# Patient Record
Sex: Male | Born: 1965 | Race: White | Hispanic: No | Marital: Married | Smoking: Light tobacco smoker
Health system: Southern US, Community
[De-identification: ages and names within clinical notes are randomized; demographics above are authoritative.]

## PROBLEM LIST (undated history)

## (undated) DIAGNOSIS — K900 Celiac disease: Secondary | ICD-10-CM

## (undated) DIAGNOSIS — I2510 Atherosclerotic heart disease of native coronary artery without angina pectoris: Secondary | ICD-10-CM

## (undated) DIAGNOSIS — I2111 ST elevation (STEMI) myocardial infarction involving right coronary artery: Secondary | ICD-10-CM

## (undated) DIAGNOSIS — Z951 Presence of aortocoronary bypass graft: Secondary | ICD-10-CM

## (undated) DIAGNOSIS — F419 Anxiety disorder, unspecified: Secondary | ICD-10-CM

## (undated) DIAGNOSIS — I742 Embolism and thrombosis of arteries of the upper extremities: Secondary | ICD-10-CM

## (undated) HISTORY — PX: OTHER SURGICAL HISTORY: SHX169

---

## 1998-04-26 ENCOUNTER — Emergency Department (HOSPITAL_COMMUNITY): Admission: EM | Admit: 1998-04-26 | Discharge: 1998-04-26 | Payer: Self-pay | Admitting: Emergency Medicine

## 2003-04-10 ENCOUNTER — Inpatient Hospital Stay (HOSPITAL_COMMUNITY): Admission: EM | Admit: 2003-04-10 | Discharge: 2003-04-12 | Payer: Self-pay | Admitting: *Deleted

## 2003-04-22 ENCOUNTER — Encounter: Admission: RE | Admit: 2003-04-22 | Discharge: 2003-04-22 | Payer: Self-pay | Admitting: Cardiology

## 2003-05-13 ENCOUNTER — Ambulatory Visit (HOSPITAL_COMMUNITY): Admission: RE | Admit: 2003-05-13 | Discharge: 2003-05-13 | Payer: Self-pay | Admitting: *Deleted

## 2003-08-13 ENCOUNTER — Encounter: Admission: RE | Admit: 2003-08-13 | Discharge: 2003-08-13 | Payer: Self-pay | Admitting: General Surgery

## 2004-12-31 ENCOUNTER — Emergency Department (HOSPITAL_COMMUNITY): Admission: EM | Admit: 2004-12-31 | Discharge: 2004-12-31 | Payer: Self-pay | Admitting: Emergency Medicine

## 2005-02-03 IMAGING — CT CT ABDOMEN W/ CM
1 series · 16 of 32 positions shown, 20 images · IV contrast (omnipaque)
Comparison: none

CLINICAL DATA: Mid abdominal pain. Nausea.  Increased white blood cell count and lipase.  
 CT ABDOMEN AND PELVIS WITH AND WITHOUT CONTRAST
TECHNIQUE: Multislice axial images were obtained through the abdomen and pelvis after administration of oral contrast and 150 cc of Omnipaque 300 IV contrast.  
 ABDOMEN
 The lung bases are clear.  The liver, spleen, pancreas, adrenal glands, and both kidneys have a normal appearance.  There is no abdominal adenopathy or free fluid.  No gross inflammatory changes are seen.  The appendix is well seen and has a normal appearance.  
 IMPRESSION
 Normal CT scan of the abdomen.  
 PELVIS
 The urinary bladder has an unremarkable appearance.  No free pelvic fluid or adenopathy are seen.  The osseous structures are unremarkable.  The bowel has a normal appearance with no evidence of gross inflammation or obstruction.  The osseous structures are normal.  
 Normal CT scan of the pelvis.

[Series 2: abd pelvis · axial · 0.86mm/px · z∈[-515,-105]mm · 16 of 116 slices shown, 20 images]
[im 8/116  soft-tissue]
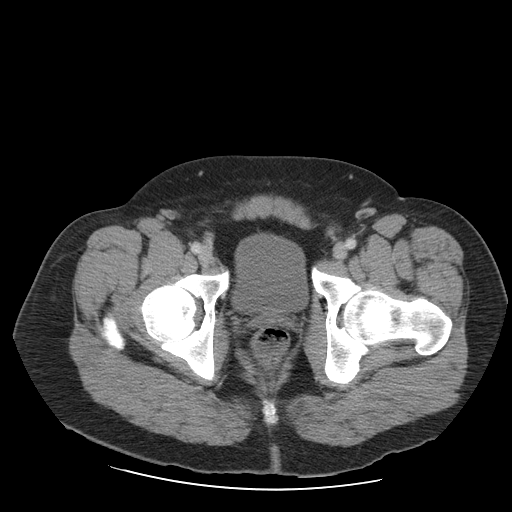
[im 8/116  bone]
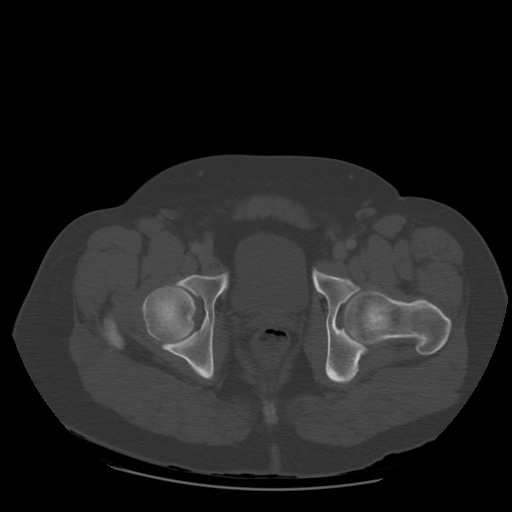
[im 15/116  soft-tissue]
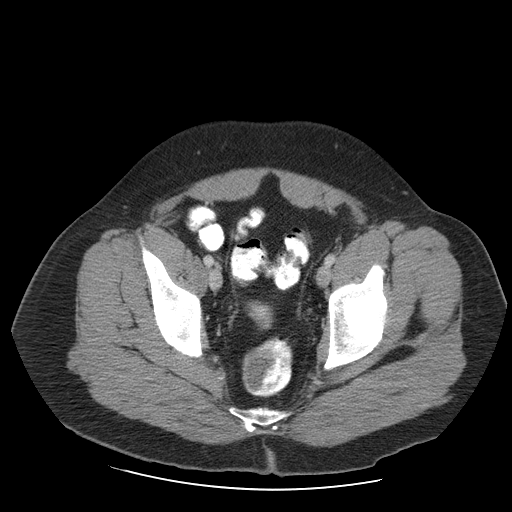
[im 23/116  soft-tissue]
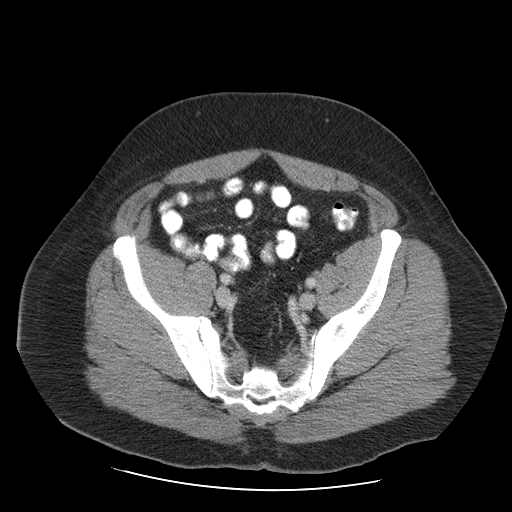
[im 30/116  soft-tissue]
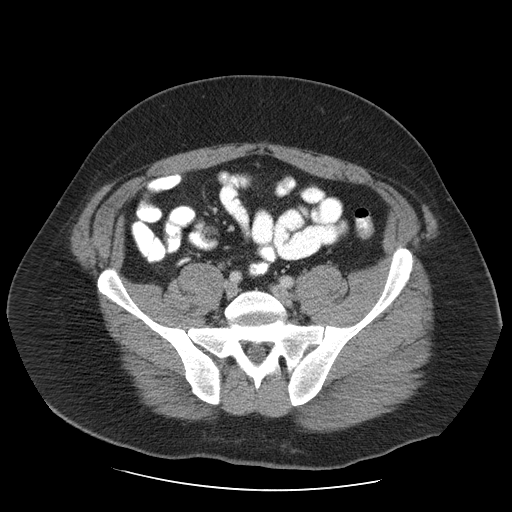
[im 38/116  soft-tissue]
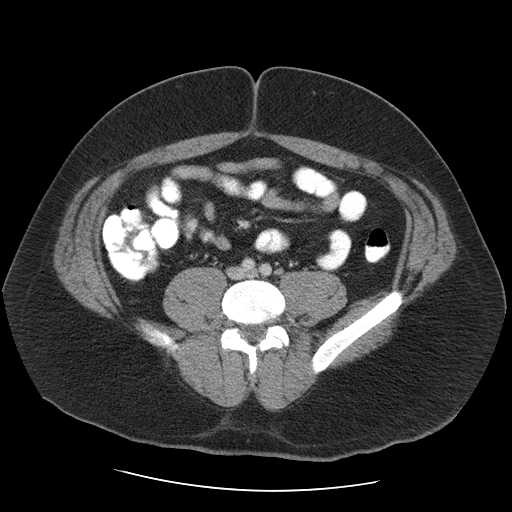
[im 45/116  soft-tissue]
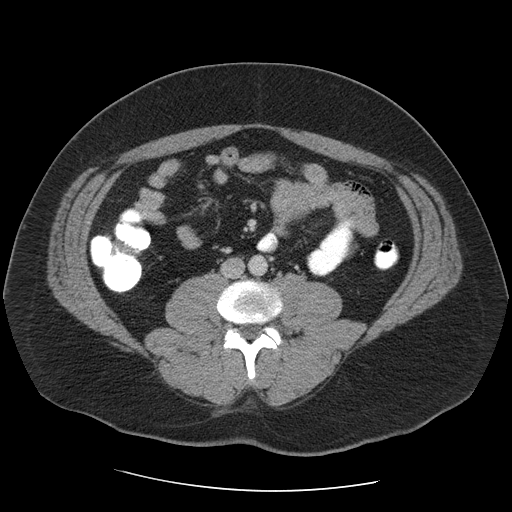
[im 52/116  soft-tissue]
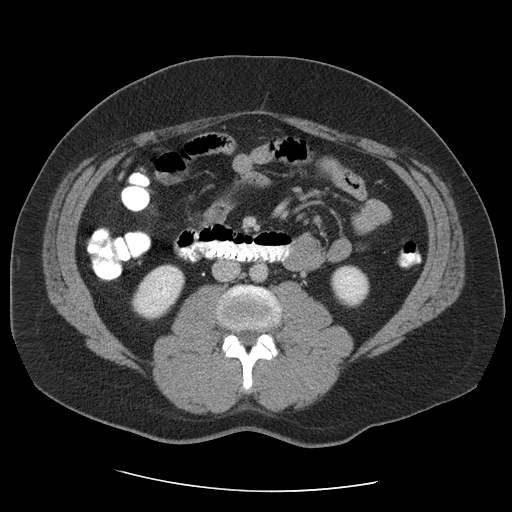
[im 64/116  soft-tissue]
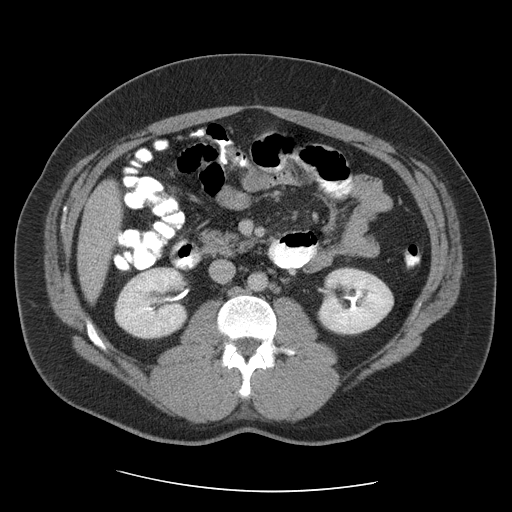
[im 71/116  soft-tissue]
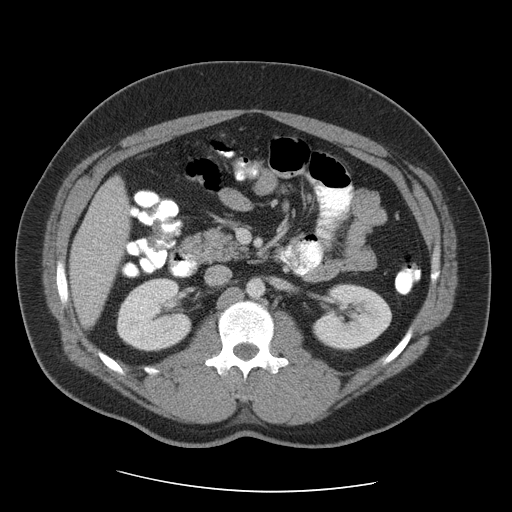
[im 71/116  bone]
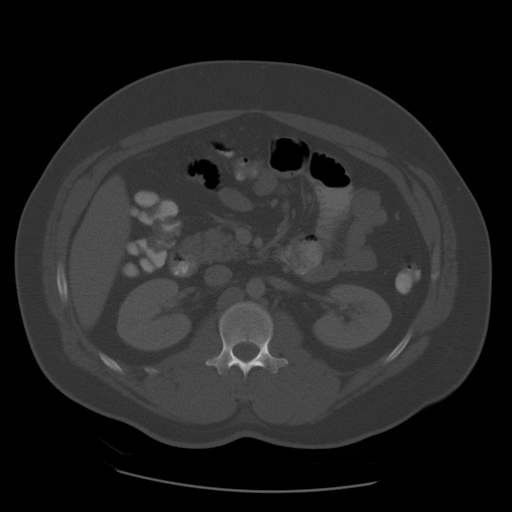
[im 78/116  soft-tissue]
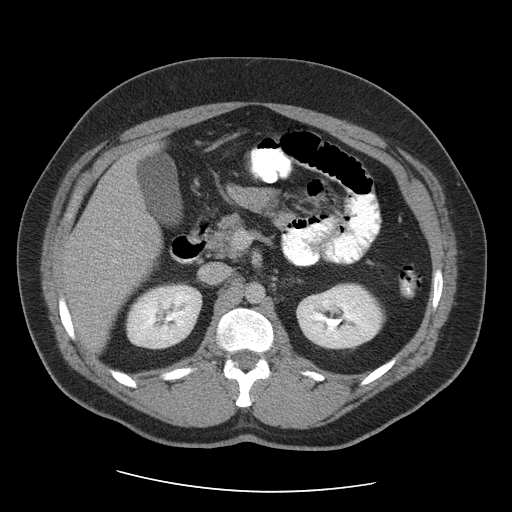
[im 86/116  soft-tissue]
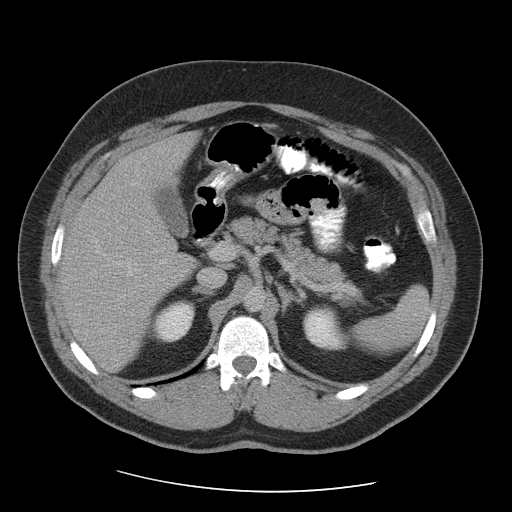
[im 93/116  soft-tissue]
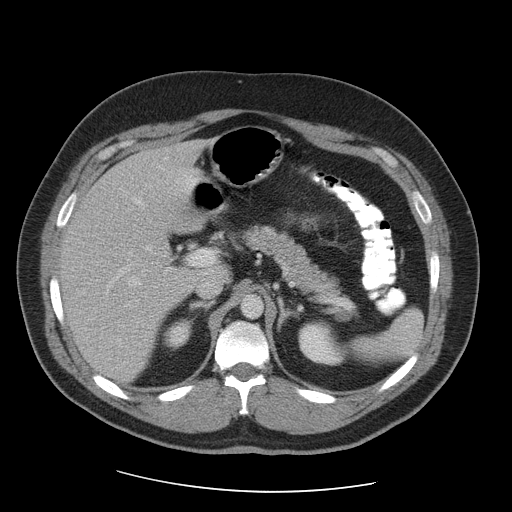
[im 101/116  soft-tissue]
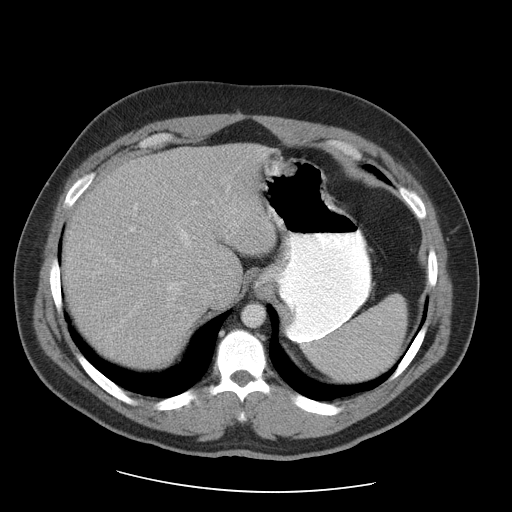
[im 101/116  lung]
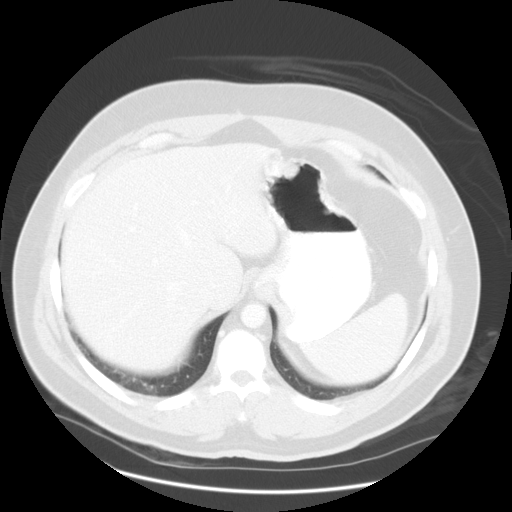
[im 104/116  lung]
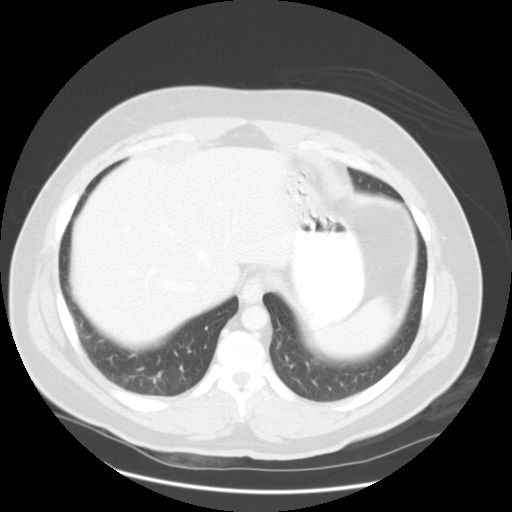
[im 108/116  soft-tissue]
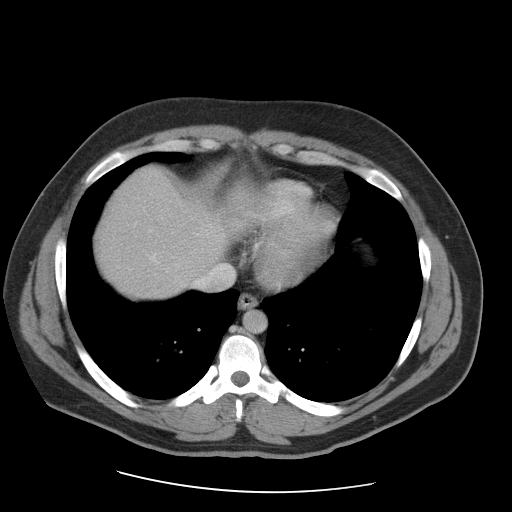
[im 108/116  lung]
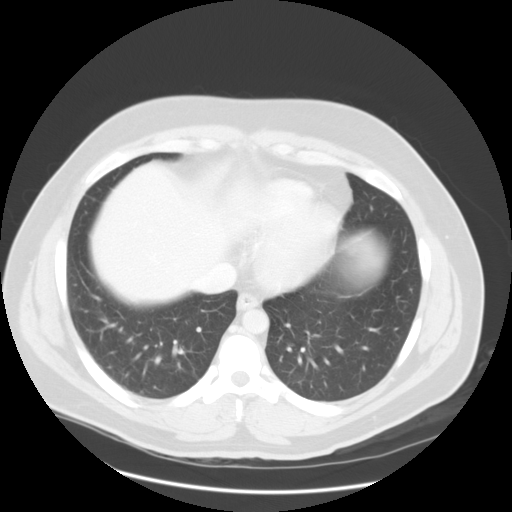
[im 112/116  lung]
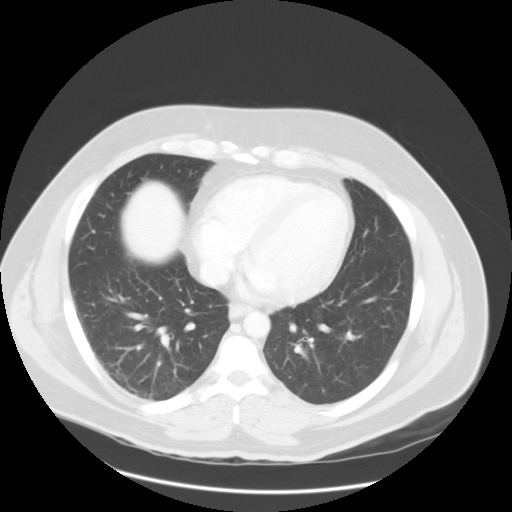

[16 of 32 positions shown; findings below may reference images not displayed]

## 2005-02-04 IMAGING — US US ABDOMEN COMPLETE
1 series · 14 of 25 positions shown · non-contrast
Comparison: none

CLINICAL DATA: Abdominal pain.  Pancreatitis.  
 ABDOMINAL  ULTRASOUND 04/11/03 AT 8788 HOURS

[Series 1: us abd · 0.33mm/px · 14 of 65 slices shown]
[im 1/65]
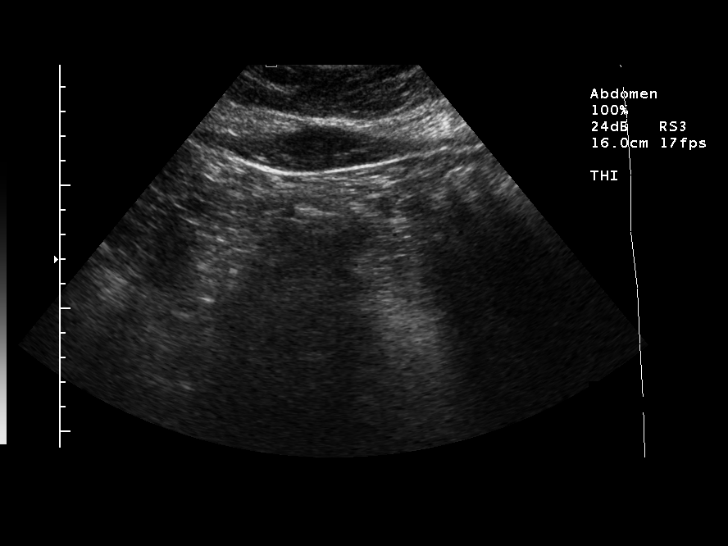
[im 6/65]
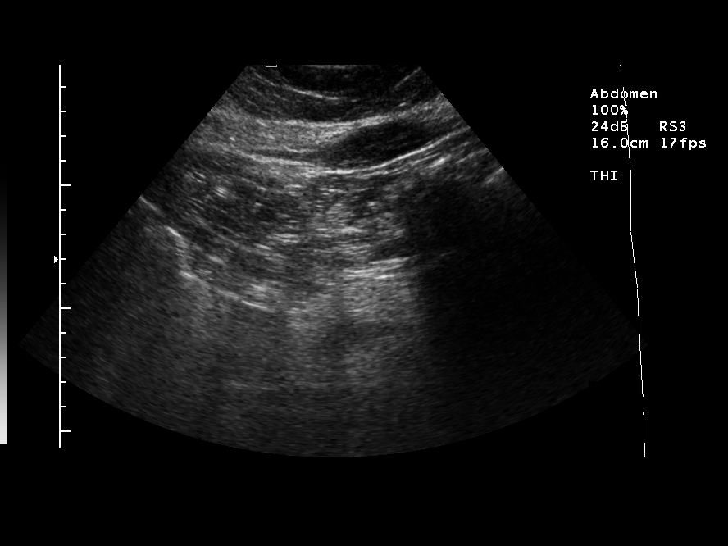
[im 11/65]
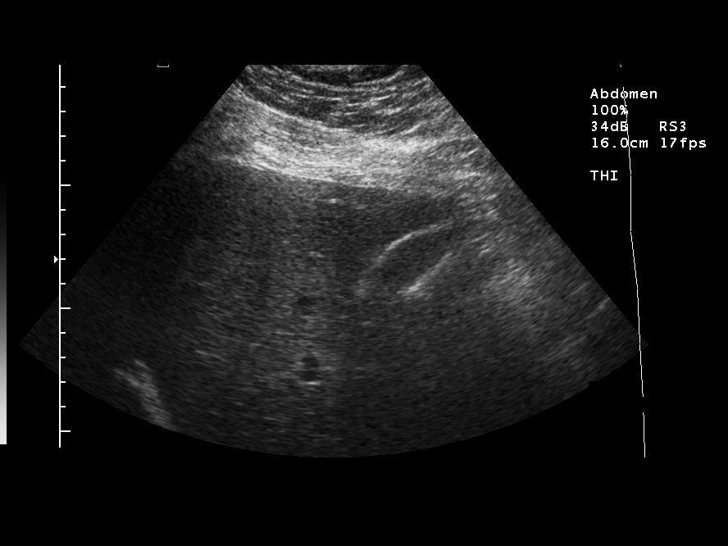
[im 17/65]
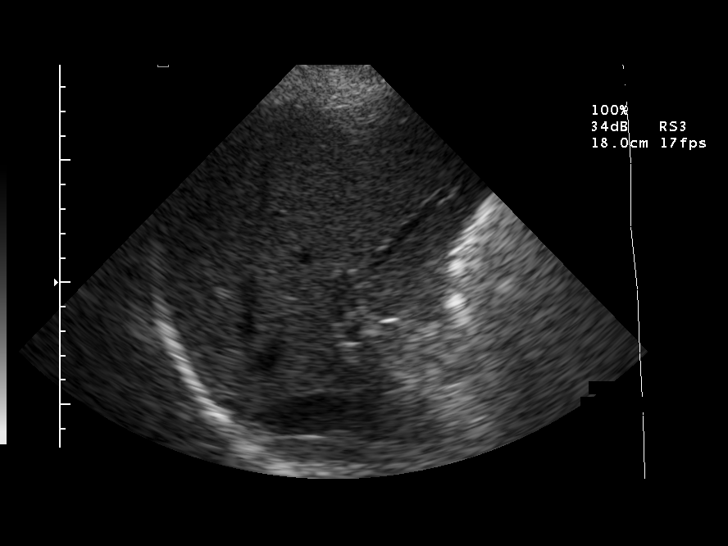
[im 22/65]
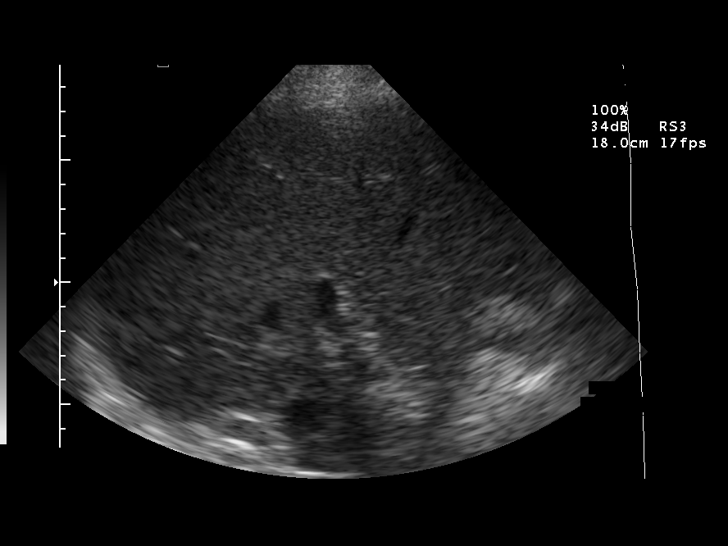
[im 25/65]
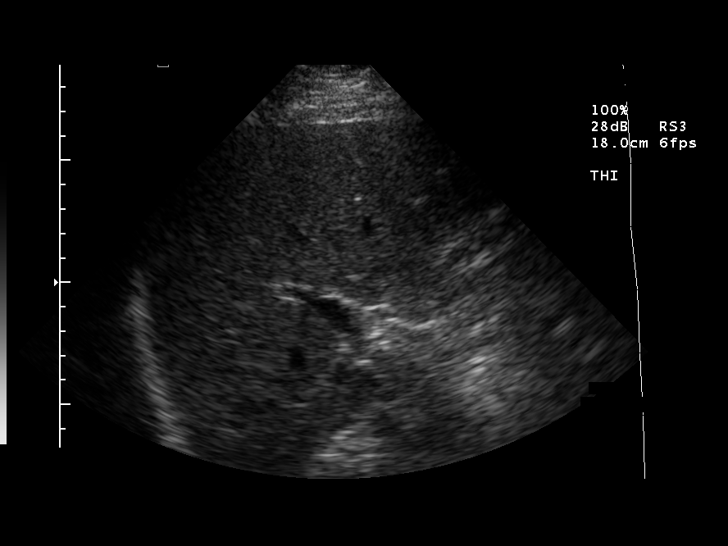
[im 30/65]
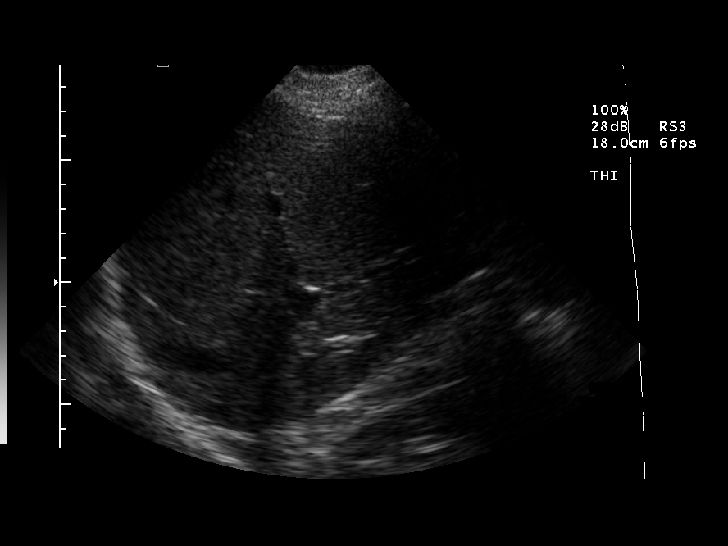
[im 35/65]
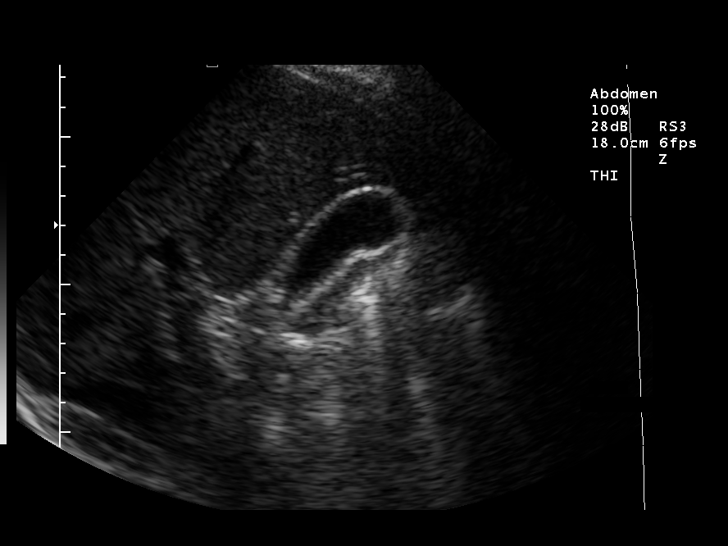
[im 41/65]
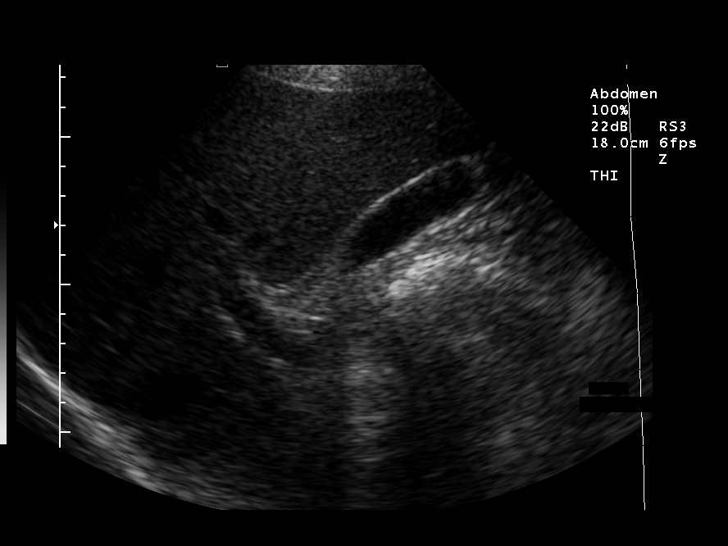
[im 43/65]
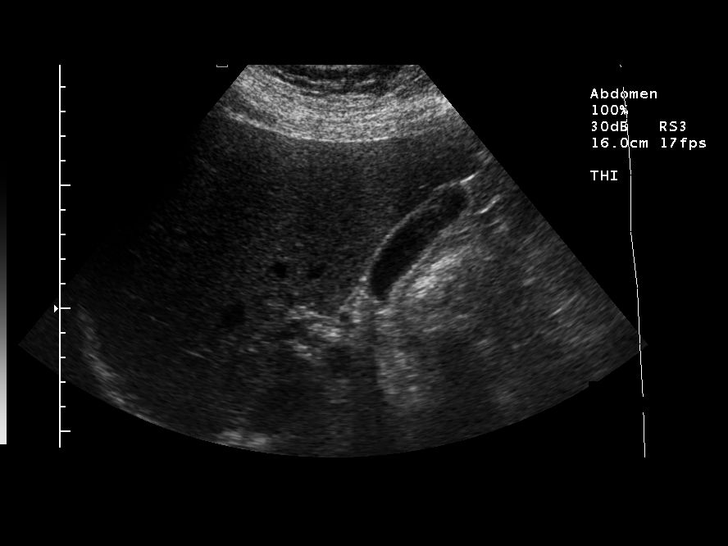
[im 49/65]
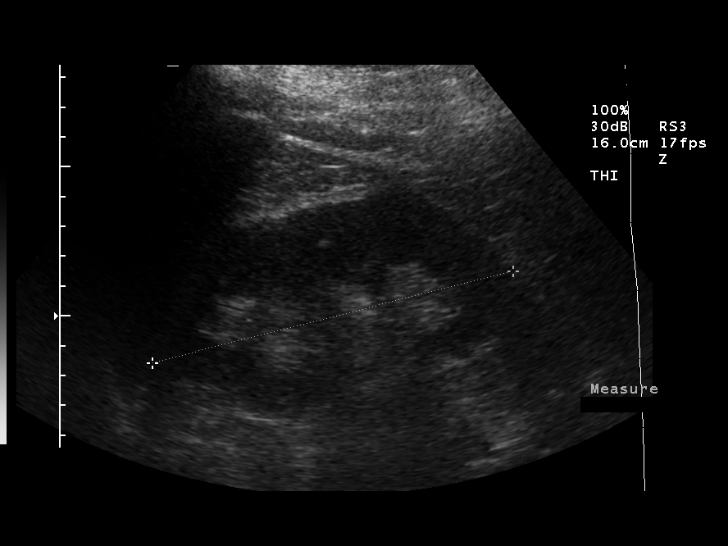
[im 54/65]
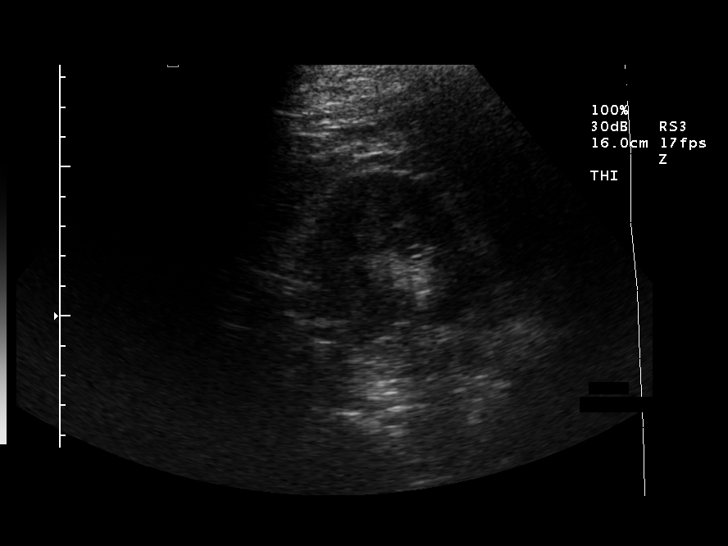
[im 59/65]
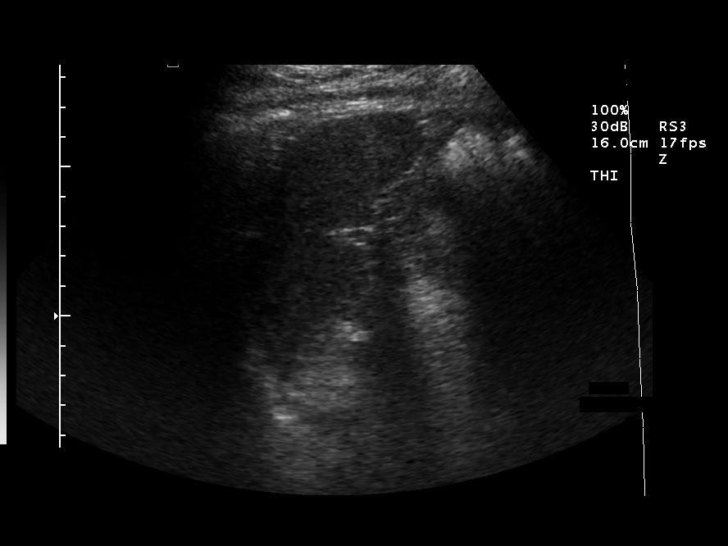
[im 65/65]
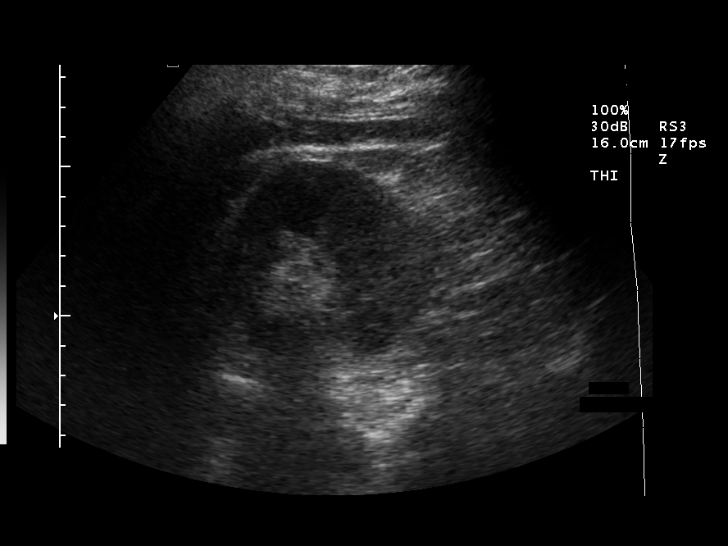

[14 of 25 positions shown; findings below may reference images not displayed]

FINDINGS: The pancreas was obscured by overlying bowel gas and could not be evaluated.  The aorta was also obscured by overlying bowel gas.  
 The liver is within normal limits without focal mass effect. 
 Within the gallbladder, there is no pericholecystic fluid or gallstones.  The wall measures 3.5 mm and is borderline thickened.  
 The spleen is within normal limits.  The right and left kidneys are 12.4 and 11.6 cm in length and are without hydronephrosis or mass effect.  
 IMPRESSION
 Limited study in that the pancreas and aorta were obscured. 
 Borderline gallbladder wall thickening without gallstones.  This is nonspecific and can be seen with inflammation of adjacent organs such as pancreatitis.

## 2006-06-13 ENCOUNTER — Ambulatory Visit: Payer: Self-pay | Admitting: Family Medicine

## 2006-06-13 LAB — CONVERTED CEMR LAB
ALT: 27 units/L (ref 0–40)
AST: 29 units/L (ref 0–37)
Albumin: 4.2 g/dL (ref 3.5–5.2)
Alkaline Phosphatase: 97 units/L (ref 39–117)
Amylase: 152 units/L — ABNORMAL HIGH (ref 27–131)
BUN: 14 mg/dL (ref 6–23)
Basophils Absolute: 0 10*3/uL (ref 0.0–0.1)
Basophils Relative: 0.2 % (ref 0.0–1.0)
Bilirubin, Direct: 0.1 mg/dL (ref 0.0–0.3)
CO2: 30 meq/L (ref 19–32)
Calcium: 9.9 mg/dL (ref 8.4–10.5)
Chloride: 106 meq/L (ref 96–112)
Creatinine, Ser: 1.1 mg/dL (ref 0.4–1.5)
Eosinophils Absolute: 0.8 10*3/uL — ABNORMAL HIGH (ref 0.0–0.6)
Eosinophils Relative: 5.2 % — ABNORMAL HIGH (ref 0.0–5.0)
GFR calc Af Amer: 94 mL/min
GFR calc non Af Amer: 78 mL/min
Glucose, Bld: 115 mg/dL — ABNORMAL HIGH (ref 70–99)
HCT: 49.8 % (ref 39.0–52.0)
Hemoglobin: 17.2 g/dL — ABNORMAL HIGH (ref 13.0–17.0)
Lipase: 89 units/L — ABNORMAL HIGH (ref 11.0–59.0)
Lymphocytes Relative: 16.9 % (ref 12.0–46.0)
MCHC: 34.6 g/dL (ref 30.0–36.0)
MCV: 87.6 fL (ref 78.0–100.0)
Monocytes Absolute: 1.5 10*3/uL — ABNORMAL HIGH (ref 0.2–0.7)
Monocytes Relative: 9.7 % (ref 3.0–11.0)
Neutro Abs: 10.5 10*3/uL — ABNORMAL HIGH (ref 1.4–7.7)
Neutrophils Relative %: 68 % (ref 43.0–77.0)
Platelets: 360 10*3/uL (ref 150–400)
Potassium: 5.3 meq/L — ABNORMAL HIGH (ref 3.5–5.1)
RBC: 5.69 M/uL (ref 4.22–5.81)
RDW: 12.5 % (ref 11.5–14.6)
Sodium: 141 meq/L (ref 135–145)
Total Bilirubin: 1 mg/dL (ref 0.3–1.2)
Total Protein: 7.4 g/dL (ref 6.0–8.3)
WBC: 15.4 10*3/uL — ABNORMAL HIGH (ref 4.5–10.5)

## 2006-06-15 ENCOUNTER — Ambulatory Visit: Payer: Self-pay | Admitting: Internal Medicine

## 2006-07-15 ENCOUNTER — Emergency Department (HOSPITAL_COMMUNITY): Admission: EM | Admit: 2006-07-15 | Discharge: 2006-07-15 | Payer: Self-pay | Admitting: Emergency Medicine

## 2010-06-04 ENCOUNTER — Encounter: Payer: Self-pay | Admitting: Cardiology

## 2010-11-03 ENCOUNTER — Emergency Department (HOSPITAL_COMMUNITY): Payer: Medicaid Other

## 2010-11-03 ENCOUNTER — Emergency Department (HOSPITAL_COMMUNITY)
Admission: EM | Admit: 2010-11-03 | Discharge: 2010-11-04 | Disposition: A | Discharge status: Home / Self Care | Payer: Medicaid Other | Attending: Emergency Medicine | Admitting: Emergency Medicine

## 2010-11-03 DIAGNOSIS — R10819 Abdominal tenderness, unspecified site: Secondary | ICD-10-CM | POA: Insufficient documentation

## 2010-11-03 DIAGNOSIS — R079 Chest pain, unspecified: Secondary | ICD-10-CM | POA: Insufficient documentation

## 2010-11-03 DIAGNOSIS — D72829 Elevated white blood cell count, unspecified: Secondary | ICD-10-CM | POA: Insufficient documentation

## 2010-11-03 DIAGNOSIS — R197 Diarrhea, unspecified: Secondary | ICD-10-CM | POA: Insufficient documentation

## 2010-11-03 DIAGNOSIS — R0989 Other specified symptoms and signs involving the circulatory and respiratory systems: Secondary | ICD-10-CM | POA: Insufficient documentation

## 2010-11-03 DIAGNOSIS — R112 Nausea with vomiting, unspecified: Secondary | ICD-10-CM | POA: Insufficient documentation

## 2010-11-03 DIAGNOSIS — R109 Unspecified abdominal pain: Secondary | ICD-10-CM | POA: Insufficient documentation

## 2010-11-03 DIAGNOSIS — R0609 Other forms of dyspnea: Secondary | ICD-10-CM | POA: Insufficient documentation

## 2010-11-03 DIAGNOSIS — D45 Polycythemia vera: Secondary | ICD-10-CM | POA: Insufficient documentation

## 2010-11-03 DIAGNOSIS — R0602 Shortness of breath: Secondary | ICD-10-CM | POA: Insufficient documentation

## 2010-11-03 LAB — COMPREHENSIVE METABOLIC PANEL
ALT: 29 U/L (ref 0–53)
AST: 22 U/L (ref 0–37)
Albumin: 4.9 g/dL (ref 3.5–5.2)
Alkaline Phosphatase: 119 U/L — ABNORMAL HIGH (ref 39–117)
BUN: 13 mg/dL (ref 6–23)
CO2: 24 mEq/L (ref 19–32)
Calcium: 11.6 mg/dL — ABNORMAL HIGH (ref 8.4–10.5)
Chloride: 97 mEq/L (ref 96–112)
Creatinine, Ser: 1.27 mg/dL (ref 0.50–1.35)
GFR calc Af Amer: >60 mL/min (ref >60)
GFR calc non Af Amer: >60 mL/min (ref >60)
Glucose, Bld: 156 mg/dL — ABNORMAL HIGH (ref 70–99)
Potassium: 4.2 mEq/L (ref 3.5–5.1)
Sodium: 138 mEq/L (ref 135–145)
Total Bilirubin: 0.5 mg/dL (ref 0.3–1.2)
Total Protein: 8.7 g/dL — ABNORMAL HIGH (ref 6.0–8.3)

## 2010-11-03 LAB — DIFFERENTIAL
Basophils Absolute: 0.2 10*3/uL — ABNORMAL HIGH (ref 0.0–0.1)
Basophils Relative: 0 % (ref 0–1)
Eosinophils Absolute: 0.5 10*3/uL (ref 0.0–0.7)
Eosinophils Relative: 1 % (ref 0–5)
Lymphocytes Relative: 8 % — ABNORMAL LOW (ref 12–46)
Lymphs Abs: 2.9 10*3/uL (ref 0.7–4.0)
Monocytes Absolute: 3.6 10*3/uL — ABNORMAL HIGH (ref 0.1–1.0)
Monocytes Relative: 9 % (ref 3–12)
Neutro Abs: 30.7 10*3/uL — ABNORMAL HIGH (ref 1.7–7.7)
Neutrophils Relative %: 81 % — ABNORMAL HIGH (ref 43–77)

## 2010-11-03 LAB — GLUCOSE, CAPILLARY: Glucose-Capillary: 173 mg/dL — ABNORMAL HIGH (ref 70–99)

## 2010-11-03 LAB — LIPASE, BLOOD: Lipase: 57 U/L (ref 11–59)

## 2010-11-03 LAB — CBC
HCT: 52.3 % — ABNORMAL HIGH (ref 39.0–52.0)
Hemoglobin: 19.2 g/dL — ABNORMAL HIGH (ref 13.0–17.0)
MCH: 31.6 pg (ref 26.0–34.0)
MCHC: 36.7 g/dL — ABNORMAL HIGH (ref 30.0–36.0)
MCV: 86 fL (ref 78.0–100.0)
Platelets: 362 10*3/uL (ref 150–400)
RBC: 6.08 MIL/uL — ABNORMAL HIGH (ref 4.22–5.81)
RDW: 12.8 % (ref 11.5–15.5)
WBC: 37.8 10*3/uL — ABNORMAL HIGH (ref 4.0–10.5)

## 2010-11-03 LAB — LACTIC ACID, PLASMA: Lactic Acid, Venous: 3.8 mmol/L — ABNORMAL HIGH (ref 0.5–2.2)

## 2010-11-03 LAB — URINALYSIS, ROUTINE W REFLEX MICROSCOPIC
Glucose, UA: 100 mg/dL — AB
Ketones, ur: 15 mg/dL — AB
Nitrite: NEGATIVE
Protein, ur: 100 mg/dL — AB
Specific Gravity, Urine: 1.024 (ref 1.005–1.030)
Urobilinogen, UA: 0.2 mg/dL (ref 0.0–1.0)
pH: 6.5 (ref 5.0–8.0)

## 2010-11-03 LAB — URINE MICROSCOPIC-ADD ON

## 2010-11-03 MED ORDER — IOHEXOL 300 MG/ML  SOLN
100.0000 mL | Freq: Once | INTRAMUSCULAR | Status: AC | PRN
  Administered 2010-11-03: 100 mL via INTRAVENOUS

## 2010-11-04 LAB — DIFFERENTIAL
Basophils Absolute: 0 10*3/uL (ref 0.0–0.1)
Basophils Relative: 0 % (ref 0–1)
Eosinophils Absolute: 0.1 10*3/uL (ref 0.0–0.7)
Eosinophils Relative: 0 % (ref 0–5)
Lymphocytes Relative: 6 % — ABNORMAL LOW (ref 12–46)
Lymphs Abs: 1.3 10*3/uL (ref 0.7–4.0)
Monocytes Absolute: 2.2 10*3/uL — ABNORMAL HIGH (ref 0.1–1.0)
Monocytes Relative: 10 % (ref 3–12)
Neutro Abs: 17.5 10*3/uL — ABNORMAL HIGH (ref 1.7–7.7)
Neutrophils Relative %: 83 % — ABNORMAL HIGH (ref 43–77)

## 2010-11-04 LAB — CBC
HCT: 44.5 % (ref 39.0–52.0)
Hemoglobin: 15.6 g/dL (ref 13.0–17.0)
MCH: 30 pg (ref 26.0–34.0)
MCHC: 35.1 g/dL (ref 30.0–36.0)
MCV: 85.6 fL (ref 78.0–100.0)
Platelets: 326 10*3/uL (ref 150–400)
RBC: 5.2 MIL/uL (ref 4.22–5.81)
RDW: 13 % (ref 11.5–15.5)
WBC: 21.1 10*3/uL — ABNORMAL HIGH (ref 4.0–10.5)

## 2011-12-28 ENCOUNTER — Encounter (HOSPITAL_COMMUNITY): Payer: Self-pay

## 2011-12-28 ENCOUNTER — Emergency Department (HOSPITAL_COMMUNITY)
Admission: EM | Admit: 2011-12-28 | Discharge: 2011-12-28 | Disposition: A | Discharge status: Home / Self Care | Payer: Medicaid Other | Attending: Emergency Medicine | Admitting: Emergency Medicine

## 2011-12-28 DIAGNOSIS — R1110 Vomiting, unspecified: Secondary | ICD-10-CM | POA: Insufficient documentation

## 2011-12-28 DIAGNOSIS — F411 Generalized anxiety disorder: Secondary | ICD-10-CM | POA: Insufficient documentation

## 2011-12-28 DIAGNOSIS — R197 Diarrhea, unspecified: Secondary | ICD-10-CM

## 2011-12-28 DIAGNOSIS — K900 Celiac disease: Secondary | ICD-10-CM | POA: Insufficient documentation

## 2011-12-28 DIAGNOSIS — R112 Nausea with vomiting, unspecified: Secondary | ICD-10-CM | POA: Insufficient documentation

## 2011-12-28 HISTORY — DX: Celiac disease: K90.0

## 2011-12-28 HISTORY — DX: Anxiety disorder, unspecified: F41.9

## 2011-12-28 LAB — URINALYSIS, ROUTINE W REFLEX MICROSCOPIC
Glucose, UA: NEGATIVE mg/dL
Hgb urine dipstick: NEGATIVE
Leukocytes, UA: NEGATIVE
Nitrite: NEGATIVE
Protein, ur: 30 mg/dL — AB
Specific Gravity, Urine: 1.031 — ABNORMAL HIGH (ref 1.005–1.030)
Urobilinogen, UA: 0.2 mg/dL (ref 0.0–1.0)
pH: 5.5 (ref 5.0–8.0)

## 2011-12-28 LAB — URINE MICROSCOPIC-ADD ON

## 2011-12-28 LAB — COMPREHENSIVE METABOLIC PANEL
ALT: 25 U/L (ref 0–53)
AST: 19 U/L (ref 0–37)
Albumin: 4.3 g/dL (ref 3.5–5.2)
Alkaline Phosphatase: 89 U/L (ref 39–117)
BUN: 19 mg/dL (ref 6–23)
CO2: 26 mEq/L (ref 19–32)
Calcium: 9 mg/dL (ref 8.4–10.5)
Chloride: 103 mEq/L (ref 96–112)
Creatinine, Ser: 0.95 mg/dL (ref 0.50–1.35)
GFR calc Af Amer: >90 mL/min (ref >90)
GFR calc non Af Amer: >90 mL/min (ref >90)
Glucose, Bld: 108 mg/dL — ABNORMAL HIGH (ref 70–99)
Potassium: 4.6 mEq/L (ref 3.5–5.1)
Sodium: 139 mEq/L (ref 135–145)
Total Bilirubin: 0.4 mg/dL (ref 0.3–1.2)
Total Protein: 7.1 g/dL (ref 6.0–8.3)

## 2011-12-28 LAB — CBC WITH DIFFERENTIAL/PLATELET
Basophils Absolute: 0.1 10*3/uL (ref 0.0–0.1)
Basophils Relative: 0 % (ref 0–1)
Eosinophils Absolute: 0.2 10*3/uL (ref 0.0–0.7)
Eosinophils Relative: 1 % (ref 0–5)
HCT: 49.9 % (ref 39.0–52.0)
Hemoglobin: 17.4 g/dL — ABNORMAL HIGH (ref 13.0–17.0)
Lymphocytes Relative: 12 % (ref 12–46)
Lymphs Abs: 2.7 10*3/uL (ref 0.7–4.0)
MCH: 30.5 pg (ref 26.0–34.0)
MCHC: 34.9 g/dL (ref 30.0–36.0)
MCV: 87.4 fL (ref 78.0–100.0)
Monocytes Absolute: 2.5 10*3/uL — ABNORMAL HIGH (ref 0.1–1.0)
Monocytes Relative: 11 % (ref 3–12)
Neutro Abs: 17.1 10*3/uL — ABNORMAL HIGH (ref 1.7–7.7)
Neutrophils Relative %: 76 % (ref 43–77)
Platelets: 360 10*3/uL (ref 150–400)
RBC: 5.71 MIL/uL (ref 4.22–5.81)
RDW: 12.6 % (ref 11.5–15.5)
WBC: 22.6 10*3/uL — ABNORMAL HIGH (ref 4.0–10.5)

## 2011-12-28 MED ORDER — SODIUM CHLORIDE 0.9 % IV SOLN
Freq: Once | INTRAVENOUS | Status: AC
  Administered 2011-12-28: 16:00:00 via INTRAVENOUS

## 2011-12-28 MED ORDER — SODIUM CHLORIDE 0.9 % IV BOLUS (SEPSIS)
1000.0000 mL | Freq: Once | INTRAVENOUS | Status: AC
  Administered 2011-12-28: 1000 mL via INTRAVENOUS

## 2011-12-28 MED ORDER — ONDANSETRON HCL 4 MG/2ML IJ SOLN
INTRAMUSCULAR | Status: AC
  Administered 2011-12-28: 14:00:00
  Filled 2011-12-28: qty 2

## 2011-12-28 MED ORDER — ONDANSETRON HCL 4 MG/2ML IJ SOLN
4.0000 mg | Freq: Once | INTRAMUSCULAR | Status: AC
  Administered 2011-12-28: 4 mg via INTRAVENOUS
  Filled 2011-12-28: qty 2

## 2011-12-28 MED ORDER — KETOROLAC TROMETHAMINE 30 MG/ML IJ SOLN
30.0000 mg | Freq: Once | INTRAMUSCULAR | Status: AC
  Administered 2011-12-28: 30 mg via INTRAVENOUS
  Filled 2011-12-28: qty 1

## 2011-12-28 MED ORDER — LOPERAMIDE HCL 2 MG PO CAPS
4.0000 mg | ORAL_CAPSULE | Freq: Once | ORAL | Status: AC
  Administered 2011-12-28: 4 mg via ORAL
  Filled 2011-12-28: qty 2

## 2011-12-28 MED ORDER — METOCLOPRAMIDE HCL 10 MG PO TABS: 10.0000 mg | ORAL_TABLET | Freq: Four times a day (QID) | ORAL | Status: DC | PRN

## 2011-12-28 NOTE — ED Notes (Signed)
Gave pt a urinal 
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
## 2011-12-28 NOTE — ED Provider Notes (Signed)
History     CSN: [id number]  Arrival date & time 12/28/11  1423   First MD Initiated Contact with Patient 12/28/11 1439      Chief Complaint  Patient presents with  . Nausea  . Emesis  . Diarrhea    (Consider location/radiation/quality/duration/timing/severity/associated sxs/prior treatment) Patient is a 46 y.o. male presenting with vomiting and diarrhea. The history is provided by the patient.  Emesis  Associated symptoms include diarrhea.  Diarrhea The primary symptoms include vomiting and diarrhea.  He has a history of Celiac Disease, and last night he got depressed and 8 a big Mac with cheese. This morning, he noted generalized achiness. In the morning, he developed severe nausea and vomiting and vomited multiple times. He also has had profuse diarrhea. This is a typical presentation for a flare up of his celiac disease. He denies fever, chills, sweats. He continues to have nausea and continues to feel like he might have more diarrhea. Nothing makes his symptoms better nothing makes them worse.  Past Medical History  Diagnosis Date  . Celiac disease   . Anxiety     No past surgical history on file.  No family history on file.  History  Substance Use Topics  . Smoking status: Never Smoker   . Smokeless tobacco: Not on file  . Alcohol Use: No      Review of Systems  Gastrointestinal: Positive for vomiting and diarrhea.  All other systems reviewed and are negative.    Allergies  Review of patient's allergies indicates no known allergies.  Home Medications  No current outpatient prescriptions on file.  BP 126/98  Pulse 105  Temp 96.9 F (36.1 C) (Rectal)  Resp 18  SpO2 100%  Physical Exam  Nursing note and vitals reviewed. 46year old male, who appears uncomfortable.. Vital signs are significant for mild tachycardia with heart rate of 105, and diastolic hypertension with blood pressure 126/98.. Oxygen saturation is 100%, which is normal. Head is  normocephalic and atraumatic. PERRLA, EOMI. Oropharynx is clear. Neck is nontender and supple without adenopathy or JVD. Back is nontender and there is no CVA tenderness. Lungs are clear without rales, wheezes, or rhonchi. Chest is nontender. Heart has regular rate and rhythm without murmur. Abdomen is soft, flat, nontender without masses or hepatosplenomegaly and peristalsis is present but hypoactive. Extremities have no cyanosis or edema, full range of motion is present. Skin is warm and dry without rash. Neurologic: Mental status is normal, cranial nerves are intact, there are no motor or sensory deficits.   ED Course  Procedures (including critical care time)  Results for orders placed during the hospital encounter of 12/28/11  CBC WITH DIFFERENTIAL      Component Value Range   WBC 22.6 (*) 4.0 - 10.5 K/uL   RBC 5.71  4.22 - 5.81 MIL/uL   Hemoglobin 17.4 (*) 13.0 - 17.0 g/dL   HCT 40.9  81.1 - 91.4 %   MCV 87.4  78.0 - 100.0 fL   MCH 30.5  26.0 - 34.0 pg   MCHC 34.9  30.0 - 36.0 g/dL   RDW 78.2  95.6 - 21.3 %   Platelets 360  150 - 400 K/uL   Neutrophils Relative 76  43 - 77 %   Neutro Abs 17.1 (*) 1.7 - 7.7 K/uL   Lymphocytes Relative 12  12 - 46 %   Lymphs Abs 2.7  0.7 - 4.0 K/uL   Monocytes Relative 11  3 - 12 %  Monocytes Absolute 2.5 (*) 0.1 - 1.0 K/uL   Eosinophils Relative 1  0 - 5 %   Eosinophils Absolute 0.2  0.0 - 0.7 K/uL   Basophils Relative 0  0 - 1 %   Basophils Absolute 0.1  0.0 - 0.1 K/uL  COMPREHENSIVE METABOLIC PANEL      Component Value Range   Sodium 139  135 - 145 mEq/L   Potassium 4.6  3.5 - 5.1 mEq/L   Chloride 103  96 - 112 mEq/L   CO2 26  19 - 32 mEq/L   Glucose, Bld 108 (*) 70 - 99 mg/dL   BUN 19  6 - 23 mg/dL   Creatinine, Ser 4.54  0.50 - 1.35 mg/dL   Calcium 9.0  8.4 - 09.8 mg/dL   Total Protein 7.1  6.0 - 8.3 g/dL   Albumin 4.3  3.5 - 5.2 g/dL   AST 19  0 - 37 U/L   ALT 25  0 - 53 U/L   Alkaline Phosphatase 89  39 - 117 U/L    Total Bilirubin 0.4  0.3 - 1.2 mg/dL   GFR calc non Af Amer >90  >90 mL/min   GFR calc Af Amer >90  >90 mL/min     1. Vomiting and diarrhea       MDM  Exacerbation of celiac disease versus a viral gastroenteritis. Treatment is symptomatic in either event. He will be given IV fluids, IV antiemetics, and oral antimotility agents.  He feels much better after IV fluids, Ondansetron, and oral Loperamide. He states that WBC in prior attacks have been 28,000. He will be discharged with prescription for Metoclopramide and is instructed to take Loperamide as needed.        Dione Booze, MD 12/28/11 [phone]

## 2011-12-28 NOTE — ED Notes (Signed)
ZOX:WRUE<AV> Expected date:<BR> Expected time:<BR> Means of arrival:<BR> Comments:<BR> ems abd pain

## 2015-04-17 ENCOUNTER — Emergency Department (HOSPITAL_COMMUNITY): Payer: Self-pay | Admitting: Certified Registered"

## 2015-04-17 ENCOUNTER — Encounter (HOSPITAL_COMMUNITY)
Admission: EM | Disposition: A | Discharge status: Home / Self Care | Payer: Medicaid Other | Source: Home / Self Care | Attending: Thoracic Surgery (Cardiothoracic Vascular Surgery)

## 2015-04-17 ENCOUNTER — Inpatient Hospital Stay (HOSPITAL_COMMUNITY)
Admission: EM | Admit: 2015-04-17 | Discharge: 2015-04-24 | DRG: 231 | Disposition: A | Discharge status: Home / Self Care | Payer: Medicaid Other | Attending: Thoracic Surgery (Cardiothoracic Vascular Surgery) | Admitting: Thoracic Surgery (Cardiothoracic Vascular Surgery)

## 2015-04-17 ENCOUNTER — Encounter (HOSPITAL_COMMUNITY): Payer: Self-pay | Admitting: Emergency Medicine

## 2015-04-17 ENCOUNTER — Emergency Department (HOSPITAL_COMMUNITY): Payer: Self-pay

## 2015-04-17 DIAGNOSIS — E785 Hyperlipidemia, unspecified: Secondary | ICD-10-CM | POA: Diagnosis present

## 2015-04-17 DIAGNOSIS — S143XXA Injury of brachial plexus, initial encounter: Secondary | ICD-10-CM | POA: Diagnosis not present

## 2015-04-17 DIAGNOSIS — Z951 Presence of aortocoronary bypass graft: Secondary | ICD-10-CM

## 2015-04-17 DIAGNOSIS — D72829 Elevated white blood cell count, unspecified: Secondary | ICD-10-CM | POA: Diagnosis not present

## 2015-04-17 DIAGNOSIS — I2510 Atherosclerotic heart disease of native coronary artery without angina pectoris: Secondary | ICD-10-CM

## 2015-04-17 DIAGNOSIS — N170 Acute kidney failure with tubular necrosis: Secondary | ICD-10-CM | POA: Diagnosis not present

## 2015-04-17 DIAGNOSIS — D62 Acute posthemorrhagic anemia: Secondary | ICD-10-CM | POA: Diagnosis not present

## 2015-04-17 DIAGNOSIS — K5900 Constipation, unspecified: Secondary | ICD-10-CM | POA: Diagnosis not present

## 2015-04-17 DIAGNOSIS — I2111 ST elevation (STEMI) myocardial infarction involving right coronary artery: Secondary | ICD-10-CM | POA: Diagnosis present

## 2015-04-17 DIAGNOSIS — I742 Embolism and thrombosis of arteries of the upper extremities: Secondary | ICD-10-CM | POA: Diagnosis not present

## 2015-04-17 DIAGNOSIS — I509 Heart failure, unspecified: Secondary | ICD-10-CM

## 2015-04-17 DIAGNOSIS — I2119 ST elevation (STEMI) myocardial infarction involving other coronary artery of inferior wall: Principal | ICD-10-CM | POA: Diagnosis present

## 2015-04-17 DIAGNOSIS — I25110 Atherosclerotic heart disease of native coronary artery with unstable angina pectoris: Secondary | ICD-10-CM

## 2015-04-17 DIAGNOSIS — F17210 Nicotine dependence, cigarettes, uncomplicated: Secondary | ICD-10-CM | POA: Diagnosis present

## 2015-04-17 DIAGNOSIS — Z8249 Family history of ischemic heart disease and other diseases of the circulatory system: Secondary | ICD-10-CM

## 2015-04-17 DIAGNOSIS — I213 ST elevation (STEMI) myocardial infarction of unspecified site: Secondary | ICD-10-CM

## 2015-04-17 DIAGNOSIS — M79641 Pain in right hand: Secondary | ICD-10-CM

## 2015-04-17 DIAGNOSIS — F79 Unspecified intellectual disabilities: Secondary | ICD-10-CM | POA: Diagnosis present

## 2015-04-17 DIAGNOSIS — R509 Fever, unspecified: Secondary | ICD-10-CM | POA: Diagnosis not present

## 2015-04-17 DIAGNOSIS — Y832 Surgical operation with anastomosis, bypass or graft as the cause of abnormal reaction of the patient, or of later complication, without mention of misadventure at the time of the procedure: Secondary | ICD-10-CM | POA: Diagnosis not present

## 2015-04-17 DIAGNOSIS — I5021 Acute systolic (congestive) heart failure: Secondary | ICD-10-CM | POA: Diagnosis not present

## 2015-04-17 DIAGNOSIS — R911 Solitary pulmonary nodule: Secondary | ICD-10-CM

## 2015-04-17 DIAGNOSIS — F419 Anxiety disorder, unspecified: Secondary | ICD-10-CM | POA: Diagnosis present

## 2015-04-17 DIAGNOSIS — J9811 Atelectasis: Secondary | ICD-10-CM | POA: Diagnosis not present

## 2015-04-17 DIAGNOSIS — E1140 Type 2 diabetes mellitus with diabetic neuropathy, unspecified: Secondary | ICD-10-CM | POA: Diagnosis present

## 2015-04-17 DIAGNOSIS — Z6841 Body Mass Index (BMI) 40.0 and over, adult: Secondary | ICD-10-CM

## 2015-04-17 HISTORY — PX: CARDIAC CATHETERIZATION: SHX172

## 2015-04-17 HISTORY — DX: Presence of aortocoronary bypass graft: Z95.1

## 2015-04-17 HISTORY — PX: CORONARY ARTERY BYPASS GRAFT: SHX141

## 2015-04-17 HISTORY — DX: ST elevation (STEMI) myocardial infarction involving right coronary artery: I21.11

## 2015-04-17 HISTORY — DX: Morbid (severe) obesity due to excess calories: E66.01

## 2015-04-17 HISTORY — DX: Atherosclerotic heart disease of native coronary artery without angina pectoris: I25.10

## 2015-04-17 HISTORY — DX: Embolism and thrombosis of arteries of the upper extremities: I74.2

## 2015-04-17 LAB — CBC
HCT: 35.7 % — ABNORMAL LOW (ref 39.0–52.0)
Hemoglobin: 12 g/dL — ABNORMAL LOW (ref 13.0–17.0)
MCH: 29.5 pg (ref 26.0–34.0)
MCHC: 33.6 g/dL (ref 30.0–36.0)
MCV: 87.7 fL (ref 78.0–100.0)
PLATELETS: 277 10*3/uL (ref 150–400)
RBC: 4.07 MIL/uL — ABNORMAL LOW (ref 4.22–5.81)
RDW: 12.9 % (ref 11.5–15.5)
WBC: 13.8 10*3/uL — AB (ref 4.0–10.5)

## 2015-04-17 LAB — CBC WITH DIFFERENTIAL/PLATELET
Basophils Absolute: 0.1 10*3/uL (ref 0.0–0.1)
Basophils Relative: 0 %
EOS ABS: 0.1 10*3/uL (ref 0.0–0.7)
Eosinophils Relative: 1 %
HCT: 41.9 % (ref 39.0–52.0)
HEMOGLOBIN: 14.1 g/dL (ref 13.0–17.0)
LYMPHS ABS: 2.7 10*3/uL (ref 0.7–4.0)
LYMPHS PCT: 16 %
MCH: 29.5 pg (ref 26.0–34.0)
MCHC: 33.7 g/dL (ref 30.0–36.0)
MCV: 87.7 fL (ref 78.0–100.0)
Monocytes Absolute: 2.9 10*3/uL — ABNORMAL HIGH (ref 0.1–1.0)
Monocytes Relative: 18 %
NEUTROS ABS: 10.9 10*3/uL — AB (ref 1.7–7.7)
NEUTROS PCT: 65 %
Platelets: 359 10*3/uL (ref 150–400)
RBC: 4.78 MIL/uL (ref 4.22–5.81)
RDW: 13 % (ref 11.5–15.5)
WBC: 16.6 10*3/uL — AB (ref 4.0–10.5)

## 2015-04-17 LAB — POCT I-STAT 3, ART BLOOD GAS (G3+)
ACID-BASE DEFICIT: 8 mmol/L — AB (ref 0.0–2.0)
BICARBONATE: 17.7 meq/L — AB (ref 20.0–24.0)
O2 SAT: 93 %
TCO2: 19 mmol/L (ref 0–100)
pCO2 arterial: 35.3 mmHg (ref 35.0–45.0)
pH, Arterial: 7.309 — ABNORMAL LOW (ref 7.350–7.450)
pO2, Arterial: 72 mmHg — ABNORMAL LOW (ref 80.0–100.0)

## 2015-04-17 LAB — APTT: APTT: 170 s — AB (ref 24–37)

## 2015-04-17 LAB — COMPREHENSIVE METABOLIC PANEL
ALK PHOS: 72 U/L (ref 38–126)
ALT: 67 U/L — AB (ref 17–63)
AST: 247 U/L — ABNORMAL HIGH (ref 15–41)
Albumin: 3.9 g/dL (ref 3.5–5.0)
Anion gap: 12 (ref 5–15)
BUN: 11 mg/dL (ref 6–20)
CALCIUM: 10.1 mg/dL (ref 8.9–10.3)
CO2: 24 mmol/L (ref 22–32)
CREATININE: 1.1 mg/dL (ref 0.61–1.24)
Chloride: 102 mmol/L (ref 101–111)
GFR calc non Af Amer: >60 mL/min (ref >60)
GLUCOSE: 172 mg/dL — AB (ref 65–99)
Potassium: 4 mmol/L (ref 3.5–5.1)
SODIUM: 138 mmol/L (ref 135–145)
Total Bilirubin: 0.8 mg/dL (ref 0.3–1.2)
Total Protein: 7 g/dL (ref 6.5–8.1)

## 2015-04-17 LAB — PROTIME-INR
INR: 4.88 — AB (ref 0.00–1.49)
Prothrombin Time: 44.1 seconds — ABNORMAL HIGH (ref 11.6–15.2)

## 2015-04-17 LAB — POCT ACTIVATED CLOTTING TIME: Activated Clotting Time: 312 seconds

## 2015-04-17 LAB — I-STAT TROPONIN, ED: Troponin i, poc: 34.59 ng/mL (ref 0.00–0.08)

## 2015-04-17 SURGERY — LEFT HEART CATH AND CORONARY ANGIOGRAPHY
Anesthesia: LOCAL

## 2015-04-17 SURGERY — CORONARY ARTERY BYPASS GRAFTING (CABG)
Anesthesia: General | Site: Chest

## 2015-04-17 MED ORDER — MIDAZOLAM HCL 2 MG/2ML IJ SOLN
INTRAMUSCULAR | Status: AC
  Filled 2015-04-17: qty 2

## 2015-04-17 MED ORDER — BIVALIRUDIN 250 MG IV SOLR
INTRAVENOUS | Status: AC
  Filled 2015-04-17: qty 250

## 2015-04-17 MED ORDER — MORPHINE SULFATE (PF) 2 MG/ML IV SOLN
INTRAVENOUS | Status: AC
  Filled 2015-04-17: qty 1

## 2015-04-17 MED ORDER — PROPOFOL 10 MG/ML IV BOLUS
INTRAVENOUS | Status: AC
  Filled 2015-04-17: qty 20

## 2015-04-17 MED ORDER — LACTATED RINGERS IV SOLN
INTRAVENOUS | Status: DC | PRN
  Administered 2015-04-17 – 2015-04-18 (×2): via INTRAVENOUS

## 2015-04-17 MED ORDER — FENTANYL CITRATE (PF) 100 MCG/2ML IJ SOLN
INTRAMUSCULAR | Status: AC
  Filled 2015-04-17: qty 2

## 2015-04-17 MED ORDER — MORPHINE SULFATE (PF) 2 MG/ML IV SOLN: 2.0000 mg | Freq: Once | INTRAVENOUS | Status: DC

## 2015-04-17 MED ORDER — PHENYLEPHRINE HCL 10 MG/ML IJ SOLN
30.0000 ug/min | INTRAMUSCULAR | Status: AC
  Administered 2015-04-18: 10 ug/min via INTRAVENOUS
  Filled 2015-04-17: qty 2

## 2015-04-17 MED ORDER — FENTANYL CITRATE (PF) 100 MCG/2ML IJ SOLN
INTRAMUSCULAR | Status: DC | PRN
  Administered 2015-04-17: 25 ug via INTRAVENOUS

## 2015-04-17 MED ORDER — NITROGLYCERIN IN D5W 200-5 MCG/ML-% IV SOLN
2.0000 ug/min | INTRAVENOUS | Status: AC
  Administered 2015-04-17: 5 ug/min via INTRAVENOUS
  Filled 2015-04-17: qty 250

## 2015-04-17 MED ORDER — 0.9 % SODIUM CHLORIDE (POUR BTL) OPTIME
TOPICAL | Status: DC | PRN
  Administered 2015-04-17: 5000 mL

## 2015-04-17 MED ORDER — ARTIFICIAL TEARS OP OINT
TOPICAL_OINTMENT | OPHTHALMIC | Status: DC | PRN
  Administered 2015-04-17: 1 via OPHTHALMIC

## 2015-04-17 MED ORDER — MIDAZOLAM HCL 5 MG/5ML IJ SOLN
INTRAMUSCULAR | Status: DC | PRN
  Administered 2015-04-17: 3 mg via INTRAVENOUS
  Administered 2015-04-17 (×2): 2 mg via INTRAVENOUS
  Administered 2015-04-18: 3 mg via INTRAVENOUS

## 2015-04-17 MED ORDER — VANCOMYCIN HCL 10 G IV SOLR
1500.0000 mg | INTRAVENOUS | Status: AC
  Administered 2015-04-17: 1.5 g via INTRAVENOUS
  Filled 2015-04-17: qty 1500

## 2015-04-17 MED ORDER — MIDAZOLAM HCL 2 MG/2ML IJ SOLN
INTRAMUSCULAR | Status: DC | PRN
  Administered 2015-04-17: 2 mg via INTRAVENOUS

## 2015-04-17 MED ORDER — ETOMIDATE 2 MG/ML IV SOLN
INTRAVENOUS | Status: DC | PRN
  Administered 2015-04-17: 20 mg via INTRAVENOUS

## 2015-04-17 MED ORDER — VERAPAMIL HCL 2.5 MG/ML IV SOLN
INTRAVENOUS | Status: DC | PRN
  Administered 2015-04-17: 21:00:00 via INTRA_ARTERIAL

## 2015-04-17 MED ORDER — HEPARIN SODIUM (PORCINE) 1000 UNIT/ML IJ SOLN
INTRAMUSCULAR | Status: DC | PRN
  Administered 2015-04-17: 35000 [IU] via INTRAVENOUS
  Administered 2015-04-17: 5000 [IU] via INTRAVENOUS

## 2015-04-17 MED ORDER — SODIUM CHLORIDE 0.9 % IJ SOLN
OROMUCOSAL | Status: DC | PRN
  Administered 2015-04-17 (×3): 6 mL via TOPICAL

## 2015-04-17 MED ORDER — AMINOCAPROIC ACID 250 MG/ML IV SOLN
10.0000 g | INTRAVENOUS | Status: DC | PRN
  Administered 2015-04-17: 5 g/h via INTRAVENOUS

## 2015-04-17 MED ORDER — VECURONIUM BROMIDE 10 MG IV SOLR
INTRAVENOUS | Status: DC | PRN
  Administered 2015-04-17: 10 mg via INTRAVENOUS

## 2015-04-17 MED ORDER — HEPARIN BOLUS VIA INFUSION: 4000.0000 [IU] | Freq: Once | INTRAVENOUS | Status: DC

## 2015-04-17 MED ORDER — DOPAMINE-DEXTROSE 3.2-5 MG/ML-% IV SOLN
0.0000 ug/kg/min | INTRAVENOUS | Status: AC
  Administered 2015-04-18: 3 ug/kg/min via INTRAVENOUS
  Filled 2015-04-17: qty 250

## 2015-04-17 MED ORDER — POTASSIUM CHLORIDE 2 MEQ/ML IV SOLN
80.0000 meq | INTRAVENOUS | Status: DC
  Filled 2015-04-17: qty 40

## 2015-04-17 MED ORDER — VANCOMYCIN HCL 1000 MG IV SOLR
INTRAVENOUS | Status: AC
  Administered 2015-04-17
  Filled 2015-04-17: qty 1000

## 2015-04-17 MED ORDER — DEXTROSE 5 % IV SOLN
750.0000 mg | INTRAVENOUS | Status: DC
  Filled 2015-04-17: qty 750

## 2015-04-17 MED ORDER — LIDOCAINE HCL (CARDIAC) 20 MG/ML IV SOLN
INTRAVENOUS | Status: DC | PRN
  Administered 2015-04-17: 100 mg via INTRAVENOUS

## 2015-04-17 MED ORDER — CANGRELOR TETRASODIUM 50 MG IV SOLR
INTRAVENOUS | Status: AC
  Filled 2015-04-17: qty 50

## 2015-04-17 MED ORDER — SODIUM CHLORIDE 0.9 % IV SOLN
INTRAVENOUS | Status: DC
  Filled 2015-04-17: qty 40

## 2015-04-17 MED ORDER — SUCCINYLCHOLINE CHLORIDE 20 MG/ML IJ SOLN
INTRAMUSCULAR | Status: DC | PRN
  Administered 2015-04-17: 130 mg via INTRAVENOUS

## 2015-04-17 MED ORDER — LIDOCAINE HCL (PF) 1 % IJ SOLN
INTRAMUSCULAR | Status: AC
  Filled 2015-04-17: qty 30

## 2015-04-17 MED ORDER — DEXTROSE 5 % IV SOLN
1.5000 g | INTRAVENOUS | Status: AC
  Administered 2015-04-17: 1.5 g via INTRAVENOUS
  Administered 2015-04-18: .75 g via INTRAVENOUS
  Filled 2015-04-17: qty 1.5

## 2015-04-17 MED ORDER — CANGRELOR TETRASODIUM 50 MG IV SOLR
50000.0000 ug | INTRAVENOUS | Status: DC | PRN
  Administered 2015-04-17: 4 ug/kg/min via INTRAVENOUS

## 2015-04-17 MED ORDER — DEXMEDETOMIDINE HCL IN NACL 400 MCG/100ML IV SOLN
0.1000 ug/kg/h | INTRAVENOUS | Status: AC
  Administered 2015-04-17: .3 ug/kg/h via INTRAVENOUS
  Filled 2015-04-17: qty 100

## 2015-04-17 MED ORDER — MIDAZOLAM HCL 10 MG/2ML IJ SOLN
INTRAMUSCULAR | Status: AC
  Filled 2015-04-17: qty 2

## 2015-04-17 MED ORDER — FENTANYL CITRATE (PF) 250 MCG/5ML IJ SOLN
INTRAMUSCULAR | Status: AC
  Filled 2015-04-17: qty 25

## 2015-04-17 MED ORDER — ROCURONIUM BROMIDE 100 MG/10ML IV SOLN
INTRAVENOUS | Status: DC | PRN
  Administered 2015-04-17 – 2015-04-18 (×6): 50 mg via INTRAVENOUS

## 2015-04-17 MED ORDER — DEXTROSE 5 % IV SOLN
0.0000 ug/min | INTRAVENOUS | Status: DC
  Filled 2015-04-17: qty 4

## 2015-04-17 MED ORDER — INSULIN REGULAR HUMAN 100 UNIT/ML IJ SOLN
INTRAMUSCULAR | Status: AC
  Administered 2015-04-17: 2.2 [IU]/h via INTRAVENOUS
  Filled 2015-04-17: qty 2.5

## 2015-04-17 MED ORDER — ASPIRIN 81 MG PO CHEW
324.0000 mg | CHEWABLE_TABLET | Freq: Once | ORAL | Status: AC
  Administered 2015-04-17: 324 mg via ORAL

## 2015-04-17 MED ORDER — HEPARIN (PORCINE) IN NACL 2-0.9 UNIT/ML-% IJ SOLN
INTRAMUSCULAR | Status: AC
  Filled 2015-04-17: qty 1500

## 2015-04-17 MED ORDER — IOHEXOL 350 MG/ML SOLN
INTRAVENOUS | Status: DC | PRN
  Administered 2015-04-17: 140 mL via INTRA_ARTERIAL

## 2015-04-17 MED ORDER — ASPIRIN 81 MG PO CHEW
CHEWABLE_TABLET | ORAL | Status: AC
  Filled 2015-04-17: qty 4

## 2015-04-17 MED ORDER — BIVALIRUDIN BOLUS VIA INFUSION - CUPID
INTRAVENOUS | Status: DC | PRN
  Administered 2015-04-17: 88.425 mg via INTRAVENOUS

## 2015-04-17 MED ORDER — CANGRELOR BOLUS VIA INFUSION
INTRAVENOUS | Status: DC | PRN
  Administered 2015-04-17: 3537 ug via INTRAVENOUS

## 2015-04-17 MED ORDER — FENTANYL CITRATE (PF) 100 MCG/2ML IJ SOLN
INTRAMUSCULAR | Status: DC | PRN
  Administered 2015-04-17 – 2015-04-18 (×6): 250 ug via INTRAVENOUS

## 2015-04-17 MED ORDER — MAGNESIUM SULFATE 50 % IJ SOLN
40.0000 meq | INTRAMUSCULAR | Status: DC
  Filled 2015-04-17: qty 10

## 2015-04-17 MED ORDER — SODIUM CHLORIDE 0.9 % IV SOLN
INTRAVENOUS | Status: DC
  Filled 2015-04-17: qty 30

## 2015-04-17 MED ORDER — BIVALIRUDIN 250 MG IV SOLR
250.0000 mg | INTRAVENOUS | Status: DC | PRN
  Administered 2015-04-17: 1.75 mg/kg/h via INTRAVENOUS
  Administered 2015-04-17: 250 mg

## 2015-04-17 MED ORDER — HEPARIN SODIUM (PORCINE) 5000 UNIT/ML IJ SOLN
INTRAMUSCULAR | Status: AC
  Administered 2015-04-17: 4000 [IU]
  Filled 2015-04-17: qty 1

## 2015-04-17 MED ORDER — PLASMA-LYTE 148 IV SOLN
INTRAVENOUS | Status: AC
  Administered 2015-04-17: 500 mL
  Filled 2015-04-17: qty 2.5

## 2015-04-17 SURGICAL SUPPLY — 119 items
ADAPTER CARDIO PERF ANTE/RETRO (ADAPTER) ×2 IMPLANT
ARTERIAL PRESSURE LINE (MISCELLANEOUS) ×4 IMPLANT
BAG DECANTER FOR FLEXI CONT (MISCELLANEOUS) ×4 IMPLANT
BANDAGE ELASTIC 4 VELCRO ST LF (GAUZE/BANDAGES/DRESSINGS) ×2 IMPLANT
BANDAGE ELASTIC 6 VELCRO ST LF (GAUZE/BANDAGES/DRESSINGS) ×2 IMPLANT
BASKET HEART (ORDER IN 25'S) (MISCELLANEOUS) ×1
BASKET HEART (ORDER IN 25S) (MISCELLANEOUS) ×1 IMPLANT
BLADE STERNUM SYSTEM 6 (BLADE) ×2 IMPLANT
BLADE SURG 11 STRL SS (BLADE) ×2 IMPLANT
BLADE SURG ROTATE 9660 (MISCELLANEOUS) IMPLANT
BNDG GAUZE ELAST 4 BULKY (GAUZE/BANDAGES/DRESSINGS) ×2 IMPLANT
CANISTER SUCTION 2500CC (MISCELLANEOUS) ×2 IMPLANT
CANNULA EZ GLIDE AORTIC 21FR (CANNULA) ×4 IMPLANT
CANNULA GUNDRY RCSP 15FR (MISCELLANEOUS) ×2 IMPLANT
CATH CPB KIT OWEN (MISCELLANEOUS) ×2 IMPLANT
CATH THORACIC 36FR (CATHETERS) ×2 IMPLANT
CLIP RETRACTION 3.0MM CORONARY (MISCELLANEOUS) ×2 IMPLANT
CLIP TI MEDIUM 24 (CLIP) IMPLANT
CLIP TI WIDE RED SMALL 24 (CLIP) IMPLANT
COVER PROBE W GEL 5X96 (DRAPES) ×2 IMPLANT
CRADLE DONUT ADULT HEAD (MISCELLANEOUS) ×2 IMPLANT
DERMABOND ADHESIVE PROPEN (GAUZE/BANDAGES/DRESSINGS) ×1
DERMABOND ADVANCED .7 DNX6 (GAUZE/BANDAGES/DRESSINGS) ×1 IMPLANT
DOUBLEWIRE STERNAL WIRE BLUNT ×2 IMPLANT
DRAIN CHANNEL 32F RND 10.7 FF (WOUND CARE) ×4 IMPLANT
DRAPE CARDIOVASCULAR INCISE (DRAPES) ×2
DRAPE INCISE IOBAN 66X45 STRL (DRAPES) ×2 IMPLANT
DRAPE SLUSH/WARMER DISC (DRAPES) ×2 IMPLANT
DRAPE SRG 135X102X78XABS (DRAPES) ×1 IMPLANT
DRSG AQUACEL AG ADV 3.5X14 (GAUZE/BANDAGES/DRESSINGS) ×2 IMPLANT
DRSG COVADERM 4X14 (GAUZE/BANDAGES/DRESSINGS) ×2 IMPLANT
DRSG KUZMA FLUFF (GAUZE/BANDAGES/DRESSINGS) ×2 IMPLANT
ELECT BLADE 4.0 EZ CLEAN MEGAD (MISCELLANEOUS) ×2
ELECT REM PT RETURN 9FT ADLT (ELECTROSURGICAL) ×4
ELECTRODE BLDE 4.0 EZ CLN MEGD (MISCELLANEOUS) ×1 IMPLANT
ELECTRODE REM PT RTRN 9FT ADLT (ELECTROSURGICAL) ×2 IMPLANT
GAUZE SPONGE 4X4 12PLY STRL (GAUZE/BANDAGES/DRESSINGS) ×4 IMPLANT
GLOVE BIOGEL PI IND STRL 6.5 (GLOVE) ×6 IMPLANT
GLOVE BIOGEL PI IND STRL 7.0 (GLOVE) ×5 IMPLANT
GLOVE BIOGEL PI INDICATOR 6.5 (GLOVE) ×6
GLOVE BIOGEL PI INDICATOR 7.0 (GLOVE) ×5
GLOVE ORTHO TXT STRL SZ7.5 (GLOVE) ×4 IMPLANT
GOWN STRL REUS W/ TWL LRG LVL3 (GOWN DISPOSABLE) ×4 IMPLANT
GOWN STRL REUS W/TWL LRG LVL3 (GOWN DISPOSABLE) ×8
HEMOSTAT POWDER SURGIFOAM 1G (HEMOSTASIS) ×6 IMPLANT
INSERT FOGARTY XLG (MISCELLANEOUS) ×2 IMPLANT
KIT BASIN OR (CUSTOM PROCEDURE TRAY) ×2 IMPLANT
KIT ROOM TURNOVER OR (KITS) ×2 IMPLANT
KIT SUCTION CATH 14FR (SUCTIONS) ×6 IMPLANT
KIT VASOVIEW W/TROCAR VH 2000 (KITS) ×2 IMPLANT
LEAD PACING MYOCARDI (MISCELLANEOUS) ×2 IMPLANT
MARKER GRAFT CORONARY BYPASS (MISCELLANEOUS) ×6 IMPLANT
NS IRRIG 1000ML POUR BTL (IV SOLUTION) ×10 IMPLANT
PACK OPEN HEART (CUSTOM PROCEDURE TRAY) ×2 IMPLANT
PAD ARMBOARD 7.5X6 YLW CONV (MISCELLANEOUS) ×4 IMPLANT
PAD ELECT DEFIB RADIOL ZOLL (MISCELLANEOUS) ×2 IMPLANT
PENCIL BUTTON HOLSTER BLD 10FT (ELECTRODE) ×2 IMPLANT
PUNCH AORTIC ROTATE  4.5MM 8IN (MISCELLANEOUS) ×2 IMPLANT
PUNCH AORTIC ROTATE 4.0MM (MISCELLANEOUS) IMPLANT
PUNCH AORTIC ROTATE 4.5MM 8IN (MISCELLANEOUS) IMPLANT
PUNCH AORTIC ROTATE 5MM 8IN (MISCELLANEOUS) IMPLANT
SET CARDIOPLEGIA MPS 5001102 (MISCELLANEOUS) ×2 IMPLANT
SOLUTION ANTI FOG 6CC (MISCELLANEOUS) IMPLANT
SPONGE GAUZE 4X4 12PLY STER LF (GAUZE/BANDAGES/DRESSINGS) ×4 IMPLANT
SPONGE LAP 18X18 X RAY DECT (DISPOSABLE) ×2 IMPLANT
SPONGE LAP 4X18 X RAY DECT (DISPOSABLE) IMPLANT
SUT BONE WAX W31G (SUTURE) ×2 IMPLANT
SUT ETHIBOND 2 0 SH (SUTURE) ×8
SUT ETHIBOND 2 0 SH 36X2 (SUTURE) ×4 IMPLANT
SUT ETHIBOND X763 2 0 SH 1 (SUTURE) ×4 IMPLANT
SUT MNCRL AB 3-0 PS2 18 (SUTURE) ×4 IMPLANT
SUT MNCRL AB 4-0 PS2 18 (SUTURE) ×2 IMPLANT
SUT PDS AB 1 CTX 36 (SUTURE) ×4 IMPLANT
SUT PROLENE 2 0 SH DA (SUTURE) IMPLANT
SUT PROLENE 3 0 SH DA (SUTURE) ×2 IMPLANT
SUT PROLENE 3 0 SH1 36 (SUTURE) IMPLANT
SUT PROLENE 4 0 RB 1 (SUTURE) ×6
SUT PROLENE 4 0 SH DA (SUTURE) IMPLANT
SUT PROLENE 4-0 RB1 .5 CRCL 36 (SUTURE) ×3 IMPLANT
SUT PROLENE 5 0 C 1 36 (SUTURE) ×4 IMPLANT
SUT PROLENE 6 0 C 1 30 (SUTURE) ×8 IMPLANT
SUT PROLENE 7.0 RB 3 (SUTURE) ×6 IMPLANT
SUT PROLENE 8 0 BV175 6 (SUTURE) ×4 IMPLANT
SUT PROLENE BLUE 7 0 (SUTURE) ×2 IMPLANT
SUT PROLENE POLY MONO (SUTURE) ×2 IMPLANT
SUT SILK  1 MH (SUTURE) ×4
SUT SILK 1 MH (SUTURE) ×4 IMPLANT
SUT SILK 1 TIES 10X30 (SUTURE) ×2 IMPLANT
SUT SILK 2 0 SH CR/8 (SUTURE) ×4 IMPLANT
SUT SILK 2 0 TIES 10X30 (SUTURE) ×2 IMPLANT
SUT SILK 2 0 TIES 17X18 (SUTURE) ×1
SUT SILK 2-0 18XBRD TIE BLK (SUTURE) ×1 IMPLANT
SUT SILK 3 0 SH CR/8 (SUTURE) ×2 IMPLANT
SUT SILK 4 0 TIE 10X30 (SUTURE) ×4 IMPLANT
SUT STEEL 6MS V (SUTURE) IMPLANT
SUT STEEL STERNAL CCS#1 18IN (SUTURE) IMPLANT
SUT STEEL SZ 6 DBL 3X14 BALL (SUTURE) ×4 IMPLANT
SUT TEM PAC WIRE 2 0 SH (SUTURE) ×8 IMPLANT
SUT VIC AB 1 CTX 18 (SUTURE) ×2 IMPLANT
SUT VIC AB 1 CTX 36 (SUTURE)
SUT VIC AB 1 CTX36XBRD ANBCTR (SUTURE) IMPLANT
SUT VIC AB 2-0 CT1 27 (SUTURE) ×2
SUT VIC AB 2-0 CT1 TAPERPNT 27 (SUTURE) ×1 IMPLANT
SUT VIC AB 2-0 CTX 27 (SUTURE) ×4 IMPLANT
SUT VIC AB 3-0 SH 27 (SUTURE)
SUT VIC AB 3-0 SH 27X BRD (SUTURE) IMPLANT
SUT VIC AB 3-0 X1 27 (SUTURE) ×4 IMPLANT
SUT VICRYL 4-0 PS2 18IN ABS (SUTURE) IMPLANT
SUTURE E-PAK OPEN HEART (SUTURE) ×2 IMPLANT
SYSTEM SAHARA CHEST DRAIN ATS (WOUND CARE) ×2 IMPLANT
TAPE CLOTH SURG 6X10 WHT LF (GAUZE/BANDAGES/DRESSINGS) ×4 IMPLANT
TAPE PAPER 2X10 WHT MICROPORE (GAUZE/BANDAGES/DRESSINGS) ×2 IMPLANT
TOWEL OR 17X24 6PK STRL BLUE (TOWEL DISPOSABLE) ×4 IMPLANT
TOWEL OR 17X26 10 PK STRL BLUE (TOWEL DISPOSABLE) ×4 IMPLANT
TRAY FOLEY IC TEMP SENS 16FR (CATHETERS) ×2 IMPLANT
TUBING INSUFFLATION (TUBING) ×2 IMPLANT
UNDERPAD 30X30 INCONTINENT (UNDERPADS AND DIAPERS) ×2 IMPLANT
WATER STERILE IRR 1000ML POUR (IV SOLUTION) ×4 IMPLANT
YANKAUER SUCT BULB TIP NO VENT (SUCTIONS) ×2 IMPLANT

## 2015-04-17 SURGICAL SUPPLY — 24 items
BALLN EMERGE MR 2.5X12 (BALLOONS) ×2
BALLN LINEAR 7.5FR IABP 40CC (BALLOONS) ×2
BALLOON EMERGE MR 2.5X12 (BALLOONS) ×1 IMPLANT
BALLOON LINEAR 7.5FR IABP 40CC (BALLOONS) ×1 IMPLANT
CATH EXTRAC PRONTO 5.5F 138CM (CATHETERS) ×2 IMPLANT
CATH INFINITI 5 FR JL3.5 (CATHETERS) ×2 IMPLANT
CATH INFINITI 5FR ANG PIGTAIL (CATHETERS) IMPLANT
CATH INFINITI 5FR MULTPACK ANG (CATHETERS) IMPLANT
CATH INFINITI JR4 5F (CATHETERS) ×2 IMPLANT
CATH VISTA GUIDE 6FR AR1 (CATHETERS) ×2 IMPLANT
DEVICE RAD COMP TR BAND LRG (VASCULAR PRODUCTS) ×2 IMPLANT
GLIDESHEATH SLEND SS 6F .021 (SHEATH) ×2 IMPLANT
GUIDE CATH RUNWAY 6FR FR4 (CATHETERS) ×2 IMPLANT
KIT ENCORE 26 ADVANTAGE (KITS) ×2 IMPLANT
KIT HEART LEFT (KITS) ×2 IMPLANT
PACK CARDIAC CATHETERIZATION (CUSTOM PROCEDURE TRAY) ×2 IMPLANT
SHEATH PINNACLE 5F 10CM (SHEATH) IMPLANT
SYR MEDRAD MARK V 150ML (SYRINGE) ×2 IMPLANT
TRANSDUCER W/STOPCOCK (MISCELLANEOUS) ×2 IMPLANT
TUBING CIL FLEX 10 FLL-RA (TUBING) ×2 IMPLANT
WIRE COUGAR XT STRL 190CM (WIRE) ×2 IMPLANT
WIRE EMERALD 3MM-J .035X150CM (WIRE) IMPLANT
WIRE HI TORQ WHISPER MS 190CM (WIRE) ×2 IMPLANT
WIRE SAFE-T 1.5MM-J .035X260CM (WIRE) ×2 IMPLANT

## 2015-04-17 NOTE — ED Notes (Signed)
[facility] contacted to page Code Stemi

## 2015-04-17 NOTE — ED Notes (Signed)
Pt alert and oriented x4 

## 2015-04-17 NOTE — ED Notes (Signed)
Mikki Santee ([phone] has been called and notified of the pt's current status . Brother is currently in [place] but wants to be called and notified once pt is in a room and about how the procedure went. Megan pt's ([phone]. Elberta Fortis (pts friend) [phone]

## 2015-04-17 NOTE — Progress Notes (Signed)
Echocardiogram Echocardiogram Transesophageal has been performed.  Lonnie Sullivan 04/17/2015, 10:56 PM

## 2015-04-17 NOTE — ED Notes (Signed)
Chest pressure mid chest radiating to left arm and right arm at times.  Some sob.

## 2015-04-17 NOTE — Consult Note (Signed)
[address]       [place] [address]             [phone]          CARDIOTHORACIC SURGERY CONSULTATION REPORT  PCP is Leonard Downing, MD Referring Provider is Burnell Blanks, MD Primary Cardiologist (new) is Minus Breeding, MD   Reason for consultation:  Critical Left Main and 3-Vessel CAD with Acute Evolving ST-segment Elevation Myocardial Infarction  HPI:  Patient is a 49 year old male with no previous history of coronary artery disease. Risk factors are only notable for a strong family history of premature coronary artery disease. The patient's lipid status is unknown. He describes 4-6 month history of progressive symptoms of exertional substernal chest discomfort and shortness of breath.  Several weeks ago he had a single episode of severe substernal chest pain associated with shortness of breath that lasted approximately 2-3 hours. He did not seek medical attention at that time. Yesterday evening the patient developed substernal chest pain associated with shortness of breath. Symptoms waxed and waned overnight and eased up earlier this morning. Proximally 2 PM this afternoon the patient developed recurrent severe substernal chest pain. He presented to the emergency department where baseline EKG revealed acute inferior ST segment elevation with posterior lateral ST segment depression. Code STEMI was called and the patient was brought directly to the Cath Lab by Dr. Angelena Form.  Catheterization demonstrates critical distal left main coronary artery disease with acute thrombotic occlusion of the right coronary artery. An attempt at primary balloon angioplasty of the right coronary artery was performed although only minimal antegrade flow could be reestablished. Emergency [facility] thoracic surgical consultation was requested. Intra-aortic balloon pump was placed in the Cath Lab.  On my arrival in the Cath Lab patient is awake and alert complaining of 5  over 10 substernal chest pain.  He remained in sinus rhythm with stable blood pressure.  He reports no other significant problems recently other than symptoms of exertional chest discomfort and shortness of breath that predated his acute onset of severe chest discomfort over the past 24 hours. He denies any history of PND, orthopnea, or lower extremity edema. He has not had palpitations, dizzy spells, or syncope. Remainder of his review of systems is noncontributory.  Past Medical History  Diagnosis Date  . Celiac disease   . Anxiety     Past Surgical History  Procedure Laterality Date  . None      Family History  Problem Relation Age of Onset  . CAD Father 60  . CAD Paternal Uncle 83    Social History   Social History  . Marital Status: Married    Spouse Name: N/A  . Number of Children: N/A  . Years of Education: N/A   Occupational History  . Not on file.   Social History Main Topics  . Smoking status: Light Tobacco Smoker  . Smokeless tobacco: Not on file  . Alcohol Use: Yes     Comment: socially  . Drug Use: No  . Sexual Activity: Not on file   Other Topics Concern  . Not on file   Social History Narrative    Prior to Admission medications   Medication Sig Start Date End Date Taking? Authorizing Provider  ibuprofen (ADVIL,MOTRIN) 200 MG tablet Take 200 mg by mouth every 6 (six) hours as needed. For pain    Historical Provider, MD  metoCLOPramide (REGLAN) 10 MG tablet Take 1 tablet (10 mg total)  by mouth every 6 (six) hours as needed (nausea). 12/28/11 [id number]  Delora Fuel, MD    Current Facility-Administered Medications  Medication Dose Route Frequency Provider Last Rate Last Dose  . aminocaproic acid (AMICAR) 10 g in sodium chloride 0.9 % 100 mL infusion   Intravenous To OR Rexene Alberts, MD      . aspirin 81 MG chewable tablet           . bivalirudin (ANGIOMAX) 250 mg in sodium chloride 0.9 % 50 mL (5 mg/mL) infusion  250 mg  Continuous PRN Burnell Blanks, MD 41.3 mL/hr at 04/17/15 2049 250 mg at 04/17/15 2107  . bivalirudin (ANGIOMAX) BOLUS via infusion    PRN Burnell Blanks, MD   88.425 mg at 04/17/15 2042  . cangrelor [facility]) 50,000 mcg in sodium chloride 0.9 % 250 mL (200 mcg/mL) infusion  50,000 mcg  Continuous PRN Burnell Blanks, MD 141.5 mL/hr at 04/17/15 2104 4 mcg/kg/min at 04/17/15 2104  . cangrelor [facility]) bolus via infusion    PRN Burnell Blanks, MD   3,537 mcg at 04/17/15 2050  . cefUROXime (ZINACEF) 1.5 g in dextrose 5 % 50 mL IVPB  1.5 g Intravenous To OR Rexene Alberts, MD      . cefUROXime (ZINACEF) 750 mg in dextrose 5 % 50 mL IVPB  750 mg Intravenous To OR Rexene Alberts, MD      . dexmedetomidine (PRECEDEX) 400 MCG/100ML (4 mcg/mL) infusion  0.1-0.7 mcg/kg/hr Intravenous To OR Rexene Alberts, MD      . DOPamine (INTROPIN) 800 mg in dextrose 5 % 250 mL (3.2 mg/mL) infusion  0-10 mcg/kg/min Intravenous To OR Rexene Alberts, MD      . EPINEPHrine (ADRENALIN) 4 mg in dextrose 5 % 250 mL (0.016 mg/mL) infusion  0-10 mcg/min Intravenous To OR Rexene Alberts, MD      . fentaNYL (SUBLIMAZE) injection    PRN Burnell Blanks, MD   25 mcg at 04/17/15 2049  . heparin 2,500 Units, papaverine 30 mg in electrolyte-148 (PLASMALYTE-148) 500 mL irrigation   Irrigation To OR Rexene Alberts, MD      . heparin 30,000 units/NS 1000 mL solution for CELLSAVER   Other To OR Rexene Alberts, MD      . heparin bolus via infusion 4,000 Units  4,000 Units Intravenous Once Julianne Rice, MD      . insulin regular (NOVOLIN R,HUMULIN R) 250 Units in sodium chloride 0.9 % 250 mL (1 Units/mL) infusion   Intravenous To OR Rexene Alberts, MD      . magnesium sulfate (IV Push/IM) injection 40 mEq  40 mEq Other To OR Rexene Alberts, MD      . midazolam (VERSED) injection    PRN Burnell Blanks, MD   2 mg at 04/17/15 2048  . morphine 2 MG/ML injection 2 mg  2 mg Intravenous Once Julianne Rice, MD      .  nitroGLYCERIN 50 mg in dextrose 5 % 250 mL (0.2 mg/mL) infusion  2-200 mcg/min Intravenous To OR Rexene Alberts, MD      . phenylephrine (NEO-SYNEPHRINE) 20 mg in dextrose 5 % 250 mL (0.08 mg/mL) infusion  30-200 mcg/min Intravenous To OR Rexene Alberts, MD      . potassium chloride injection 80 mEq  80 mEq Other To OR Rexene Alberts, MD      . Radial Cocktail/Verapamil only    PRN  Burnell Blanks, MD      . vancomycin [facility]) 1,000 mg in sodium chloride 0.9 % 1,000 mL irrigation   Irrigation To OR Rexene Alberts, MD      . vancomycin (VANCOCIN) 1,500 mg in sodium chloride 0.9 % 250 mL IVPB  1,500 mg Intravenous To OR Rexene Alberts, MD        No Known Allergies    Review of Systems:  Per HPI.  Remainder non-contributory    Physical Exam:   BP 128/80 mmHg  Pulse 102  Temp(Src) 98 F (36.7 C) (Oral)  Resp 17  Ht 5\' 10"  (1.778 m)  Wt 117.935 kg (260 lb)  BMI 37.31 kg/m2  SpO2 97%  General:  Obese male on cath lab table, breathing comfortably  HEENT:  Unremarkable   Neck:   no JVD, no bruits, no adenopathy   Chest:   clear to auscultation, symmetrical breath sounds, no wheezes, no rhonchi   CV:   RRR, no murmur   Abdomen:  soft, non-tender, no masses   Extremities:  warm, well-perfused, pulses , no lower extremity edema  Rectal/GU  Deferred  Neuro:   Grossly non-focal and symmetrical throughout  Skin:   Clean and dry, no rashes, no breakdown  Diagnostic Tests:  CARDIAC CATHETERIZATION Procedures    Left Heart Cath and Coronary Angiography    Conclusion     Ost RCA to Prox RCA lesion, 100% stenosed.  LM lesion, 99% stenosed.  1st Mrg lesion, 30% stenosed.  Ost LAD lesion, 90% stenosed.  1st Diag lesion, 40% stenosed.  Dist LAD lesion, 30% stenosed.  1. Acute inferior STEMI secondary to occluded proximal RCA. 2. Severe stenosis distal Left main artery.  3. PTCA with balloon angioplasty of the RCA 4. Thrombectomy of the RCA 5. Placement  IABP  Recommendations: Pt taken to the OR for emergent CABG with Dr. Roxy Manns.      Indications    ST elevation myocardial infarction (STEMI), unspecified artery (HCC) [I21.3 (ICD-10-CM)]   Acute ST elevation myocardial infarction (STEMI) involving right coronary artery (HCC) [I21.11 (ICD-10-CM)]    Technique and Indications    Estimated blood loss <50 mL. Indication: Acute inferior STEMI, presenting 12-18 hours after the onset of symptoms.   Procedure: The risks, benefits, complications, treatment options, and expected outcomes were discussed with the patient. Emergency consent obtained. The patient was further sedated with Versed and Fentanyl. The right wrist was assessed with a modified Allens test which was positive. The right wrist was prepped and draped in a sterile fashion. 1% lidocaine was used for local anesthesia. Using the modified Seldinger access technique, a 5/6 French sheath was placed in the right radial artery. 3 mg Verapamil was given through the sheath. 4000 units IV heparin was given in the ED. Standard diagnostic catheters were used to perform selective coronary angiography. His RCA was occluded. Left main with severe distal stenosis. CT surgery was called and we prepared for PCI of the RCA to re-establish flow while awaiting emergent CABG.   PCI Note: Angiomax bolus and drip. Cangrelor bolus and drip. AR-1 guiding catheter. Cougar IC wire down the RCA. 2.5 x 12 mm balloon inflated x 12 in the proximal, mid and distal vessel. Flow not restored due to heavy thrombus burden. Pronto aspiration catheter with 4 passes. Thrombus was removed but flow was still TIMI-1 into the mid vessel and TIMI-0 in the distal vessel. At this point, the PCI was aborted. Dr. Roxy Manns arrived in the cath lab  and agreed that CABG was the best revascularization option. The right groin was prepped and draped. A sheath was placed and an IABP was inserted. The IABP was placed at 1:1. The radial sheath was sewn  in. The patient was taken to OR for emergent CABG.    Coronary Findings    Dominance: Right   Left Main   . LM lesion, 99% stenosed. Discrete.     Left Anterior Descending   . Ost LAD lesion, 90% stenosed. Discrete.   Jorene Minors LAD lesion, 30% stenosed. Discrete.   . First Diagonal Branch   . 1st Diag lesion, 40% stenosed. Discrete.     Left Circumflex   . First Obtuse Marginal Branch   . 1st Mrg lesion, 30% stenosed. Diffuse.   . Second Obtuse Marginal Branch   The vessel is small in size.     Right Coronary Artery   . Ost RCA to Prox RCA lesion, 100% stenosed. Thrombotic.      Coronary Diagrams    Diagnostic Diagram            Implants    Name ID Temporary Type Supply   No information to display    PACS Images    Show images for Cardiac catheterization     Link to Procedure Log    Procedure Log      Hemo Data       Most Recent Value   AO Systolic Pressure  [id number] mmHg   AO Diastolic Pressure  79 mmHg   AO Mean  95 mmHg       Impression:  Patient has critical 99% stenosis of the left main coronary artery with 100% acute thrombotic occlusion of the right coronary artery and acute evolving inferior ST segment elevation myocardial infarction. Initial onset of chest pain was more than 24 hours ago and attempt at primary balloon angioplasty of the right coronary artery was notable for extensive clot burden throughout the culprit vessel. Given the circumstances agree the patient needs emergency surgical revascularization.   Plan:  I have discussed the nature of the problem with the patient in the cath lab.  The need for emergency surgical revascularization was discussed.  The patient understands and accepts all potential associated risks of surgery including but not limited to risk of death, stroke or other neurologic complication, myocardial infarction, congestive heart failure, respiratory failure, renal failure, bleeding requiring blood  transfusion and/or reexploration, aortic dissection or other major vascular complication, arrhythmia, heart block or bradycardia requiring permanent pacemaker, pneumonia, pleural effusion, wound infection, pulmonary embolus or other thromboembolic complication, chronic pain or other delayed complications related to median sternotomy, or the late recurrence of symptomatic ischemic heart disease and/or congestive heart failure.  All questions answered.   I spent in excess of 60 minutes during the conduct of this hospital consultation and >50% of this time involved direct face-to-face encounter for counseling and/or coordination of the patient's care.   Valentina Gu. Roxy Manns, MD 04/17/2015 9:32 PM

## 2015-04-17 NOTE — Anesthesia Preprocedure Evaluation (Addendum)
Anesthesia Evaluation  Patient identified by MRN, date of birth, ID bandPreop documentation limited or incomplete due to emergent nature of procedure.  History of Anesthesia Complications Negative for: history of anesthetic complications  Airway Mallampati: III  TM Distance: >3 FB Neck ROM: Full    Dental  (+) Teeth Intact, Dental Advisory Given   Pulmonary Current Smoker,           Cardiovascular + angina with exertion and at rest + CAD and + Past MI       Neuro/Psych PSYCHIATRIC DISORDERS Anxiety negative neurological ROS     GI/Hepatic negative GI ROS, Neg liver ROS,   Endo/Other  Morbid obesity  Renal/GU negative Renal ROS     Musculoskeletal   Abdominal   Peds  Hematology   Anesthesia Other Findings   Reproductive/Obstetrics                            Anesthesia Physical Anesthesia Plan  ASA: IV and emergent  Anesthesia Plan: General   Post-op Pain Management:    Induction: Intravenous, Rapid sequence and Cricoid pressure planned  Airway Management Planned: Oral ETT  Additional Equipment: PA Cath and 3D TEE  Intra-op Plan:   Post-operative Plan: Post-operative intubation/ventilation  Informed Consent:   Plan Discussed with: Anesthesiologist and Surgeon  Anesthesia Plan Comments:        Anesthesia Quick Evaluation

## 2015-04-17 NOTE — ED Provider Notes (Signed)
CSN: [id number]     Arrival date & time 04/17/15  1935 History   First MD Initiated Contact with Patient 04/17/15 1950     Chief Complaint  Patient presents with  . Chest Pain     (Consider location/radiation/quality/duration/timing/severity/associated sxs/prior Treatment) HPI Patient presents with roughly 24 hours of waxing and waning left sided chest pain. Pain radiates to bilateral arms. Admits to mild shortness of breath. Patient states that he is normally very active and over the last few months has had increased shortness of breath and chest pain with exertion that resolves with rest. He has no previous cardiac history. States he has an uncle who had an MI but no other cardiac history. No lower extremity swelling or pain. States occasionally smokes.  Past Medical History  Diagnosis Date  . Celiac disease   . Anxiety    Past Surgical History  Procedure Laterality Date  . None     Family History  Problem Relation Age of Onset  . CAD Father 64  . CAD Paternal Uncle 32   Social History  Substance Use Topics  . Smoking status: Light Tobacco Smoker  . Smokeless tobacco: None  . Alcohol Use: Yes     Comment: socially    Review of Systems  Constitutional: Negative for fever and chills.  Respiratory: Positive for shortness of breath. Negative for cough.   Cardiovascular: Positive for chest pain. Negative for palpitations and leg swelling.  Gastrointestinal: Positive for nausea. Negative for vomiting, abdominal pain, diarrhea and constipation.  Genitourinary: Negative for dysuria and hematuria.  Musculoskeletal: Negative for myalgias, back pain, arthralgias, neck pain and neck stiffness.  Skin: Negative for rash and wound.  Neurological: Negative for dizziness, weakness, light-headedness, numbness and headaches.  All other systems reviewed and are negative.     Allergies  Review of patient's allergies indicates no known allergies.  Home Medications   Prior to  Admission medications   Medication Sig Start Date End Date Taking? Authorizing Provider  ibuprofen (ADVIL,MOTRIN) 200 MG tablet Take 200 mg by mouth every 6 (six) hours as needed. For pain    Historical Provider, MD  metoCLOPramide (REGLAN) 10 MG tablet Take 1 tablet (10 mg total) by mouth every 6 (six) hours as needed (nausea). 12/28/11 [id number]  Delora Fuel, MD   BP [id number] mmHg  Pulse 102  Temp(Src) 98 F (36.7 C) (Oral)  Resp 17  Ht 5\' 10"  (1.778 m)  Wt 260 lb (117.935 kg)  BMI 37.31 kg/m2  SpO2 98% Physical Exam  Constitutional: He is oriented to person, place, and time. He appears well-developed and well-nourished. He appears distressed.  HENT:  Head: Normocephalic and atraumatic.  Mouth/Throat: Oropharynx is clear and moist.  Eyes: EOM are normal. Pupils are equal, round, and reactive to light.  Neck: Normal range of motion. Neck supple.  Cardiovascular: Regular rhythm.  Exam reveals no gallop and no friction rub.   No murmur heard. Tachycardia  Pulmonary/Chest: Effort normal and breath sounds normal. No respiratory distress. He has no wheezes. He has no rales. He exhibits no tenderness.  Abdominal: Soft. Bowel sounds are normal. He exhibits no distension and no mass. There is no tenderness. There is no rebound and no guarding.  Musculoskeletal: Normal range of motion. He exhibits no edema or tenderness.  No lower extremity swelling or pain. Distal pulses intact.  Neurological: He is alert and oriented to person, place, and time.  Moving all extremities without deficit. Sensation is fully intact.  Skin: Skin is  warm and dry. No rash noted. No erythema.  Psychiatric: He has a normal mood and affect. His behavior is normal.  Nursing note and vitals reviewed.   ED Course  Procedures (including critical care time) Labs Review Labs Reviewed  CBC WITH DIFFERENTIAL/PLATELET - Abnormal; Notable for the following:    WBC 16.6 (*)    Neutro Abs 10.9 (*)    Monocytes Absolute 2.9  (*)    All other components within normal limits  I-STAT TROPOININ, ED - Abnormal; Notable for the following:    Troponin i, poc 34.59 (*)    All other components within normal limits  COMPREHENSIVE METABOLIC PANEL    Imaging Review No results found. I have personally reviewed and evaluated these images and lab results as part of my medical decision-making.   EKG Interpretation   Date/Time:  Saturday April 17 2015 19:44:22 EST Ventricular Rate:  101 PR Interval:  180 QRS Duration: 102 QT Interval:  354 QTC Calculation: 459 R Axis:   29 Text Interpretation:  Critical Test Result: STEMI Sinus tachycardia  Inferior infarct , possibly acute Anterior infarct , age undetermined  Marked ST abnormality, possible lateral subendocardial injury ACUTE  MI / STEMI  Consider right ventricular involvement in acute inferior  infarct Abnormal ECG Confirmed by Mckinzey Entwistle  MD, Onnie Hatchel ([id number]) on  04/17/2015 8:20:51 PM     CRITICAL CARE Performed by: Lita Mains, Rosell Khouri Total critical care time: 20 minutes Critical care time was exclusive of separately billable procedures and treating other patients. Critical care was necessary to treat or prevent imminent or life-threatening deterioration. Critical care was time spent personally by me on the following activities: development of treatment plan with patient and/or surrogate as well as nursing, discussions with consultants, evaluation of patient's response to treatment, examination of patient, obtaining history from patient or surrogate, ordering and performing treatments and interventions, ordering and review of laboratory studies, ordering and review of radiographic studies, pulse oximetry and re-evaluation of patient's condition.  MDM   Final diagnoses:  ST elevation myocardial infarction (STEMI), unspecified artery ([place])    Patient with several weeks of social dyspnea and chest pain presents with 24 hours of waxing and waning chest pain. EKG with  evidence of inferior ST segment elevation with reciprocal changes. Code STEMI was called immediately upon receipt of EKG at [place]. Discussed with Dr.McAlhany, interventional list on-call who recommends 4000 units of heparin and aspirin and will take to Cath Lab. Dr. Percival Spanish at bedside.     Julianne Rice, MD 04/17/15 2023

## 2015-04-17 NOTE — ED Notes (Signed)
To cath lab with [email or web address]

## 2015-04-17 NOTE — Anesthesia Procedure Notes (Addendum)
Procedure Name: Intubation Date/Time: 04/17/2015 10:10 PM Performed by: Manuela Schwartz B Pre-anesthesia Checklist: Patient identified, Emergency Drugs available, Suction available, Patient being monitored and Timeout performed Patient Re-evaluated:Patient Re-evaluated prior to inductionOxygen Delivery Method: Circle system utilized Preoxygenation: Pre-oxygenation with 100% oxygen Intubation Type: IV induction, Rapid sequence and Cricoid Pressure applied Laryngoscope Size: Mac and 3 Grade View: Grade I Tube type: Subglottic suction tube Tube size: 8.0 mm Number of attempts: 1 Airway Equipment and Method: Stylet Placement Confirmation: ETT inserted through vocal cords under direct vision,  positive ETCO2 and breath sounds checked- equal and bilateral Secured at: 23 cm Tube secured with: Tape Dental Injury: Teeth and Oropharynx as per pre-operative assessment

## 2015-04-17 NOTE — H&P (Signed)
CARDIOLOGY ADMISSION NOTE  Patient ID: Lonnie Sullivan MRN: [id number] DOB/AGE: 51/22/1930 51 y.o.  Admit date: 04/16/2015 Primary Physician   Dr. Laverta Baltimore [facility]) Primary Cardiologist   None Chief Complaint    Chest pain  HPI:  The patient has no past cardiac history.  He has had exertional chest pain over about a year.  He had some severe resting pain earlier this year but did not seek medical care.  He had pain starting last night.  It was waxing and waning from about 6 PM.  It eased this morning.  However, it became worse at about 2 pm.  He presented to the ED with initial EKG at 19:44.  This demonstrated inferior ST elevation with posterolateral ST depression.  Code STEMI was called.     Past Medical History  Diagnosis Date  . Mental retardation   . Psychosis   . Pneumonia   . Ileus ([place])   . Arthralgia     History reviewed. No pertinent past surgical history.  No Known Allergies No current facility-administered medications on file prior to encounter.   No current outpatient prescriptions on file prior to encounter.   Social History   Social History  . Marital Status: Unknown    Spouse Name: N/A  . Number of Children: N/A  . Years of Education: N/A   Occupational History  . Not on file.   Social History Main Topics  . Smoking status: Never Smoker   . Smokeless tobacco: Not on file  . Alcohol Use: Not on file  . Drug Use: Not on file  . Sexual Activity: Not on file   Other Topics Concern  . Not on file   Social History Narrative  . No narrative on file    No family history on file.   ROS:  As stated in the HPI and negative for all other systems.   Physical Exam: Blood pressure 146/69, pulse 82, temperature 98 F (36.7 C), temperature source Oral, resp. rate 22, height 5\' 11"  (1.803 m), weight 100 lb (45.36 kg), SpO2 95 %.  GENERAL:   Positive acute distress HEENT:  Pupils equal round and reactive, fundi not visualized, oral mucosa  unremarkable NECK:  No jugular venous distention, waveform within normal limits, carotid upstroke brisk and symmetric, no bruits, no thyromegaly LYMPHATICS:  No cervical, inguinal adenopathy LUNGS:  Clear to auscultation bilaterally BACK:  No CVA tenderness CHEST:  Unremarkable HEART:  PMI not displaced or sustained,S1 and S2 within normal limits, no S3, no S4, no clicks, no rubs, no murmurs ABD:  Flat, positive bowel sounds normal in frequency in pitch, no bruits, no rebound, no guarding, no midline pulsatile mass, no hepatomegaly, no splenomegaly, obese EXT:  2 plus pulses throughout, no edema, no cyanosis no clubbing SKIN:  No rashes no nodules NEURO:  Cranial nerves II through XII grossly intact, motor grossly intact throughout PSYCH:  Cognitively intact, oriented to person place and time   Labs: Lab Results  Component Value Date   BUN 44* 04/17/2015   Lab Results  Component Value Date   CREATININE 1.35* 04/17/2015   Lab Results  Component Value Date   NA 146* 04/17/2015   K 4.0 04/17/2015   CL 112* 04/17/2015   CO2 25 04/17/2015   Lab Results  Component Value Date   TROPONINI 0.12* 04/17/2015   Lab Results  Component Value Date   WBC 17.0* 04/17/2015   HGB 13.4 04/17/2015   HCT 41.4 04/17/2015  MCV 96.5 04/17/2015   PLT 199 04/17/2015   No results found for: CHOL, HDL, LDLCALC, LDLDIRECT, TRIG, CHOLHDL Lab Results  Component Value Date   ALT 25 04/17/2015   AST 30 04/17/2015   ALKPHOS 75 04/17/2015   BILITOT 0.9 04/17/2015      Radiology:  CXR:  Pending  EKG:  NSR, rate 101, acute inferior 3 mm ST elevation with anterolateral ST depression  ASSESSMENT AND PLAN:    ACUTE INFERIOR MI:  The patient will be transported urgently to the cath lab.   SignedMinus Breeding 04/17/2015, 8:12 PM

## 2015-04-18 ENCOUNTER — Encounter (HOSPITAL_COMMUNITY): Payer: Self-pay | Admitting: Thoracic Surgery (Cardiothoracic Vascular Surgery)

## 2015-04-18 ENCOUNTER — Inpatient Hospital Stay (HOSPITAL_COMMUNITY): Payer: Self-pay

## 2015-04-18 DIAGNOSIS — I2510 Atherosclerotic heart disease of native coronary artery without angina pectoris: Secondary | ICD-10-CM | POA: Diagnosis not present

## 2015-04-18 DIAGNOSIS — I2111 ST elevation (STEMI) myocardial infarction involving right coronary artery: Secondary | ICD-10-CM | POA: Diagnosis not present

## 2015-04-18 DIAGNOSIS — I25110 Atherosclerotic heart disease of native coronary artery with unstable angina pectoris: Secondary | ICD-10-CM

## 2015-04-18 DIAGNOSIS — E118 Type 2 diabetes mellitus with unspecified complications: Secondary | ICD-10-CM

## 2015-04-18 DIAGNOSIS — Z951 Presence of aortocoronary bypass graft: Secondary | ICD-10-CM

## 2015-04-18 HISTORY — DX: Presence of aortocoronary bypass graft: Z95.1

## 2015-04-18 LAB — CBC
HCT: 38.5 % — ABNORMAL LOW (ref 39.0–52.0)
HCT: 34.9 % — ABNORMAL LOW (ref 39.0–52.0)
HEMOGLOBIN: 12.7 g/dL — AB (ref 13.0–17.0)
HEMOGLOBIN: 11.2 g/dL — AB (ref 13.0–17.0)
MCH: 29.3 pg (ref 26.0–34.0)
MCH: 28.7 pg (ref 26.0–34.0)
MCHC: 33 g/dL (ref 30.0–36.0)
MCHC: 32.1 g/dL (ref 30.0–36.0)
MCV: 88.9 fL (ref 78.0–100.0)
MCV: 89.5 fL (ref 78.0–100.0)
PLATELETS: 214 10*3/uL (ref 150–400)
Platelets: 232 10*3/uL (ref 150–400)
RBC: 4.33 MIL/uL (ref 4.22–5.81)
RBC: 3.9 MIL/uL — ABNORMAL LOW (ref 4.22–5.81)
RDW: 13 % (ref 11.5–15.5)
RDW: 13.2 % (ref 11.5–15.5)
WBC: 26 10*3/uL — AB (ref 4.0–10.5)
WBC: 18.4 10*3/uL — ABNORMAL HIGH (ref 4.0–10.5)

## 2015-04-18 LAB — POCT I-STAT, CHEM 8
BUN: 11 mg/dL (ref 6–20)
BUN: 11 mg/dL (ref 6–20)
BUN: 12 mg/dL (ref 6–20)
BUN: 12 mg/dL (ref 6–20)
BUN: 12 mg/dL (ref 6–20)
BUN: 13 mg/dL (ref 6–20)
BUN: 15 mg/dL (ref 6–20)
CALCIUM ION: 1.14 mmol/L (ref 1.12–1.23)
CALCIUM ION: 1.17 mmol/L (ref 1.12–1.23)
CALCIUM ION: 1.23 mmol/L (ref 1.12–1.23)
CALCIUM ION: 1.33 mmol/L — AB (ref 1.12–1.23)
CHLORIDE: 105 mmol/L (ref 101–111)
CREATININE: 0.8 mg/dL (ref 0.61–1.24)
CREATININE: 0.8 mg/dL (ref 0.61–1.24)
Calcium, Ion: 1.17 mmol/L (ref 1.12–1.23)
Calcium, Ion: 1.28 mmol/L — ABNORMAL HIGH (ref 1.12–1.23)
Calcium, Ion: 1.3 mmol/L — ABNORMAL HIGH (ref 1.12–1.23)
Chloride: 100 mmol/L — ABNORMAL LOW (ref 101–111)
Chloride: 101 mmol/L (ref 101–111)
Chloride: 102 mmol/L (ref 101–111)
Chloride: 102 mmol/L (ref 101–111)
Chloride: 98 mmol/L — ABNORMAL LOW (ref 101–111)
Chloride: 99 mmol/L — ABNORMAL LOW (ref 101–111)
Creatinine, Ser: 0.8 mg/dL (ref 0.61–1.24)
Creatinine, Ser: 0.9 mg/dL (ref 0.61–1.24)
Creatinine, Ser: 0.9 mg/dL (ref 0.61–1.24)
Creatinine, Ser: 0.9 mg/dL (ref 0.61–1.24)
Creatinine, Ser: 1.1 mg/dL (ref 0.61–1.24)
GLUCOSE: 227 mg/dL — AB (ref 65–99)
GLUCOSE: 298 mg/dL — AB (ref 65–99)
Glucose, Bld: 148 mg/dL — ABNORMAL HIGH (ref 65–99)
Glucose, Bld: 159 mg/dL — ABNORMAL HIGH (ref 65–99)
Glucose, Bld: 183 mg/dL — ABNORMAL HIGH (ref 65–99)
Glucose, Bld: 285 mg/dL — ABNORMAL HIGH (ref 65–99)
Glucose, Bld: 139 mg/dL — ABNORMAL HIGH (ref 65–99)
HCT: 29 % — ABNORMAL LOW (ref 39.0–52.0)
HCT: 29 % — ABNORMAL LOW (ref 39.0–52.0)
HCT: 30 % — ABNORMAL LOW (ref 39.0–52.0)
HEMATOCRIT: 29 % — AB (ref 39.0–52.0)
HEMATOCRIT: 33 % — AB (ref 39.0–52.0)
HEMATOCRIT: 34 % — AB (ref 39.0–52.0)
HEMATOCRIT: 37 % — AB (ref 39.0–52.0)
HEMOGLOBIN: 10.2 g/dL — AB (ref 13.0–17.0)
HEMOGLOBIN: 11.6 g/dL — AB (ref 13.0–17.0)
HEMOGLOBIN: 12.6 g/dL — AB (ref 13.0–17.0)
HEMOGLOBIN: 9.9 g/dL — AB (ref 13.0–17.0)
HEMOGLOBIN: 9.9 g/dL — AB (ref 13.0–17.0)
HEMOGLOBIN: 9.9 g/dL — AB (ref 13.0–17.0)
Hemoglobin: 11.2 g/dL — ABNORMAL LOW (ref 13.0–17.0)
POTASSIUM: 4.9 mmol/L (ref 3.5–5.1)
POTASSIUM: 4.9 mmol/L (ref 3.5–5.1)
Potassium: 4.4 mmol/L (ref 3.5–5.1)
Potassium: 6.4 mmol/L (ref 3.5–5.1)
Potassium: 5.2 mmol/L — ABNORMAL HIGH (ref 3.5–5.1)
Potassium: 6.9 mmol/L (ref 3.5–5.1)
Potassium: 4.8 mmol/L (ref 3.5–5.1)
SODIUM: 135 mmol/L (ref 135–145)
SODIUM: 135 mmol/L (ref 135–145)
SODIUM: 137 mmol/L (ref 135–145)
SODIUM: 137 mmol/L (ref 135–145)
SODIUM: 140 mmol/L (ref 135–145)
Sodium: 131 mmol/L — ABNORMAL LOW (ref 135–145)
Sodium: 132 mmol/L — ABNORMAL LOW (ref 135–145)
TCO2: 22 mmol/L (ref 0–100)
TCO2: 23 mmol/L (ref 0–100)
TCO2: 24 mmol/L (ref 0–100)
TCO2: 24 mmol/L (ref 0–100)
TCO2: 27 mmol/L (ref 0–100)
TCO2: 27 mmol/L (ref 0–100)
TCO2: 28 mmol/L (ref 0–100)

## 2015-04-18 LAB — GLUCOSE, CAPILLARY
GLUCOSE-CAPILLARY: 98 mg/dL (ref 65–99)
GLUCOSE-CAPILLARY: 151 mg/dL — AB (ref 65–99)
GLUCOSE-CAPILLARY: 152 mg/dL — AB (ref 65–99)
GLUCOSE-CAPILLARY: 147 mg/dL — AB (ref 65–99)
GLUCOSE-CAPILLARY: 154 mg/dL — AB (ref 65–99)
GLUCOSE-CAPILLARY: 138 mg/dL — AB (ref 65–99)
Glucose-Capillary: 114 mg/dL — ABNORMAL HIGH (ref 65–99)
Glucose-Capillary: 118 mg/dL — ABNORMAL HIGH (ref 65–99)
Glucose-Capillary: 119 mg/dL — ABNORMAL HIGH (ref 65–99)
Glucose-Capillary: 143 mg/dL — ABNORMAL HIGH (ref 65–99)
Glucose-Capillary: 119 mg/dL — ABNORMAL HIGH (ref 65–99)
Glucose-Capillary: 151 mg/dL — ABNORMAL HIGH (ref 65–99)
Glucose-Capillary: 155 mg/dL — ABNORMAL HIGH (ref 65–99)

## 2015-04-18 LAB — BASIC METABOLIC PANEL
Anion gap: 3 — ABNORMAL LOW (ref 5–15)
BUN: 12 mg/dL (ref 6–20)
CALCIUM: 11.8 mg/dL — AB (ref 8.9–10.3)
CO2: 27 mmol/L (ref 22–32)
CREATININE: 1.07 mg/dL (ref 0.61–1.24)
Chloride: 109 mmol/L (ref 101–111)
GFR calc Af Amer: >60 mL/min (ref >60)
GLUCOSE: 117 mg/dL — AB (ref 65–99)
Potassium: 4.8 mmol/L (ref 3.5–5.1)
Sodium: 139 mmol/L (ref 135–145)

## 2015-04-18 LAB — POCT I-STAT 3, ART BLOOD GAS (G3+)
ACID-BASE DEFICIT: 1 mmol/L (ref 0.0–2.0)
Acid-Base Excess: 2 mmol/L (ref 0.0–2.0)
Acid-base deficit: 1 mmol/L (ref 0.0–2.0)
Acid-base deficit: 2 mmol/L (ref 0.0–2.0)
BICARBONATE: 23.5 meq/L (ref 20.0–24.0)
BICARBONATE: 24.5 meq/L — AB (ref 20.0–24.0)
BICARBONATE: 25.3 meq/L — AB (ref 20.0–24.0)
BICARBONATE: 27.3 meq/L — AB (ref 20.0–24.0)
BICARBONATE: 28.4 meq/L — AB (ref 20.0–24.0)
O2 SAT: 99 %
O2 Saturation: 100 %
O2 Saturation: 99 %
O2 Saturation: 95 %
O2 Saturation: 94 %
PCO2 ART: 67.4 mmHg — AB (ref 35.0–45.0)
PCO2 ART: 40.9 mmHg (ref 35.0–45.0)
PH ART: 7.324 — AB (ref 7.350–7.450)
PO2 ART: 169 mmHg — AB (ref 80.0–100.0)
PO2 ART: 295 mmHg — AB (ref 80.0–100.0)
PO2 ART: 71 mmHg — AB (ref 80.0–100.0)
Patient temperature: 38
Patient temperature: 98.6
Patient temperature: 98.6
TCO2: 30 mmol/L (ref 0–100)
TCO2: 29 mmol/L (ref 0–100)
TCO2: 27 mmol/L (ref 0–100)
TCO2: 26 mmol/L (ref 0–100)
TCO2: 25 mmol/L (ref 0–100)
pCO2 arterial: 54.7 mmHg — ABNORMAL HIGH (ref 35.0–45.0)
pCO2 arterial: 50.3 mmHg — ABNORMAL HIGH (ref 35.0–45.0)
pCO2 arterial: 40.4 mmHg (ref 35.0–45.0)
pH, Arterial: 7.221 — ABNORMAL LOW (ref 7.350–7.450)
pH, Arterial: 7.315 — ABNORMAL LOW (ref 7.350–7.450)
pH, Arterial: 7.391 (ref 7.350–7.450)
pH, Arterial: 7.367 (ref 7.350–7.450)
pO2, Arterial: 169 mmHg — ABNORMAL HIGH (ref 80.0–100.0)
pO2, Arterial: 79 mmHg — ABNORMAL LOW (ref 80.0–100.0)

## 2015-04-18 LAB — HEMOGLOBIN AND HEMATOCRIT, BLOOD
HEMATOCRIT: 29.5 % — AB (ref 39.0–52.0)
HEMOGLOBIN: 9.8 g/dL — AB (ref 13.0–17.0)

## 2015-04-18 LAB — POCT I-STAT 4, (NA,K, GLUC, HGB,HCT)
GLUCOSE: 110 mg/dL — AB (ref 65–99)
HEMATOCRIT: 36 % — AB (ref 39.0–52.0)
HEMOGLOBIN: 12.2 g/dL — AB (ref 13.0–17.0)
POTASSIUM: 4.6 mmol/L (ref 3.5–5.1)
Sodium: 140 mmol/L (ref 135–145)

## 2015-04-18 LAB — CK TOTAL AND CKMB (NOT AT ARMC)
CK, MB: >260 ng/mL — ABNORMAL HIGH (ref 0.5–5.0)
Total CK: 4410 U/L — ABNORMAL HIGH (ref 49–397)

## 2015-04-18 LAB — CREATININE, SERUM
CREATININE: 1.19 mg/dL (ref 0.61–1.24)
GFR calc Af Amer: >60 mL/min (ref >60)
GFR calc non Af Amer: >60 mL/min (ref >60)

## 2015-04-18 LAB — MAGNESIUM
MAGNESIUM: 3.4 mg/dL — AB (ref 1.7–2.4)
Magnesium: 2.1 mg/dL (ref 1.7–2.4)

## 2015-04-18 LAB — PROTIME-INR
INR: 1.45 (ref 0.00–1.49)
PROTHROMBIN TIME: 17.7 s — AB (ref 11.6–15.2)

## 2015-04-18 LAB — APTT: APTT: 30 s (ref 24–37)

## 2015-04-18 LAB — PLATELET COUNT: PLATELETS: 243 10*3/uL (ref 150–400)

## 2015-04-18 LAB — MRSA PCR SCREENING: MRSA BY PCR: NEGATIVE

## 2015-04-18 MED ORDER — SODIUM CHLORIDE 0.9 % IV SOLN
INTRAVENOUS | Status: DC
  Filled 2015-04-18: qty 2.5

## 2015-04-18 MED ORDER — METOPROLOL TARTRATE 25 MG/10 ML ORAL SUSPENSION
12.5000 mg | Freq: Two times a day (BID) | ORAL | Status: DC
  Filled 2015-04-18: qty 5

## 2015-04-18 MED ORDER — ROCURONIUM BROMIDE 50 MG/5ML IV SOLN
INTRAVENOUS | Status: AC
  Filled 2015-04-18: qty 1

## 2015-04-18 MED ORDER — LACTATED RINGERS IV SOLN
INTRAVENOUS | Status: DC
  Administered 2015-04-18 (×2): via INTRAVENOUS

## 2015-04-18 MED ORDER — SODIUM CHLORIDE 0.9 % IJ SOLN
3.0000 mL | Freq: Two times a day (BID) | INTRAMUSCULAR | Status: DC
  Administered 2015-04-19: 3 mL via INTRAVENOUS
  Administered 2015-04-19: 10 mL via INTRAVENOUS
  Administered 2015-04-22 – 2015-04-24 (×3): 3 mL via INTRAVENOUS

## 2015-04-18 MED ORDER — CHLORHEXIDINE GLUCONATE 0.12 % MT SOLN
15.0000 mL | OROMUCOSAL | Status: AC
  Administered 2015-04-18: 15 mL via OROMUCOSAL

## 2015-04-18 MED ORDER — ALBUMIN HUMAN 5 % IV SOLN
250.0000 mL | INTRAVENOUS | Status: AC | PRN
  Administered 2015-04-18 (×4): 250 mL via INTRAVENOUS
  Filled 2015-04-18: qty 250

## 2015-04-18 MED ORDER — CHLORHEXIDINE GLUCONATE 0.12% ORAL RINSE (MEDLINE KIT)
15.0000 mL | Freq: Two times a day (BID) | OROMUCOSAL | Status: DC
  Administered 2015-04-18: 15 mL via OROMUCOSAL

## 2015-04-18 MED ORDER — METOPROLOL TARTRATE 12.5 MG HALF TABLET
12.5000 mg | ORAL_TABLET | Freq: Two times a day (BID) | ORAL | Status: DC
  Administered 2015-04-19 – 2015-04-24 (×10): 12.5 mg via ORAL
  Filled 2015-04-18 (×10): qty 1

## 2015-04-18 MED ORDER — ASPIRIN 81 MG PO CHEW: 324.0000 mg | CHEWABLE_TABLET | Freq: Every day | ORAL | Status: DC

## 2015-04-18 MED ORDER — SODIUM CHLORIDE 0.9 % IV SOLN: INTRAVENOUS | Status: DC

## 2015-04-18 MED ORDER — ARTIFICIAL TEARS OP OINT
TOPICAL_OINTMENT | OPHTHALMIC | Status: AC
  Filled 2015-04-18: qty 3.5

## 2015-04-18 MED ORDER — MORPHINE SULFATE (PF) 2 MG/ML IV SOLN
2.0000 mg | INTRAVENOUS | Status: DC | PRN
  Administered 2015-04-18 (×2): 2 mg via INTRAVENOUS
  Filled 2015-04-18 (×3): qty 1

## 2015-04-18 MED ORDER — INSULIN DETEMIR 100 UNIT/ML ~~LOC~~ SOLN
20.0000 [IU] | Freq: Two times a day (BID) | SUBCUTANEOUS | Status: DC
  Administered 2015-04-18 (×2): 20 [IU] via SUBCUTANEOUS
  Filled 2015-04-18 (×3): qty 0.2

## 2015-04-18 MED ORDER — PNEUMOCOCCAL VAC POLYVALENT 25 MCG/0.5ML IJ INJ: 0.5000 mL | INJECTION | INTRAMUSCULAR | Status: DC | PRN

## 2015-04-18 MED ORDER — ACETAMINOPHEN 160 MG/5ML PO SOLN
650.0000 mg | Freq: Once | ORAL | Status: AC
  Administered 2015-04-18: 650 mg

## 2015-04-18 MED ORDER — EPHEDRINE SULFATE 50 MG/ML IJ SOLN
INTRAMUSCULAR | Status: AC
  Filled 2015-04-18: qty 1

## 2015-04-18 MED ORDER — MILRINONE IN DEXTROSE 20 MG/100ML IV SOLN
INTRAVENOUS | Status: AC
  Filled 2015-04-18: qty 100

## 2015-04-18 MED ORDER — SODIUM CHLORIDE 0.9 % IJ SOLN
INTRAMUSCULAR | Status: AC
  Filled 2015-04-18: qty 10

## 2015-04-18 MED ORDER — ACETAMINOPHEN 160 MG/5ML PO SOLN
1000.0000 mg | Freq: Four times a day (QID) | ORAL | Status: DC
  Filled 2015-04-18: qty 40.6

## 2015-04-18 MED ORDER — LACTATED RINGERS IV SOLN
500.0000 mL | Freq: Once | INTRAVENOUS | Status: AC | PRN
  Administered 2015-04-18: 500 mL via INTRAVENOUS

## 2015-04-18 MED ORDER — DEXMEDETOMIDINE HCL IN NACL 200 MCG/50ML IV SOLN
0.0000 ug/kg/h | INTRAVENOUS | Status: DC
  Administered 2015-04-18 – 2015-04-19 (×2): 0.1 ug/kg/h via INTRAVENOUS
  Administered 2015-04-19: 0.3 ug/kg/h via INTRAVENOUS
  Filled 2015-04-18 (×3): qty 50

## 2015-04-18 MED ORDER — MAGNESIUM SULFATE 4 GM/100ML IV SOLN
4.0000 g | Freq: Once | INTRAVENOUS | Status: AC
  Administered 2015-04-18: 4 g via INTRAVENOUS
  Filled 2015-04-18: qty 100

## 2015-04-18 MED ORDER — SUCCINYLCHOLINE CHLORIDE 20 MG/ML IJ SOLN
INTRAMUSCULAR | Status: AC
  Filled 2015-04-18: qty 1

## 2015-04-18 MED ORDER — DEXMEDETOMIDINE HCL IN NACL 200 MCG/50ML IV SOLN
0.0000 ug/kg/h | INTRAVENOUS | Status: DC
  Administered 2015-04-18: 0.7 ug/kg/h via INTRAVENOUS

## 2015-04-18 MED ORDER — FAMOTIDINE IN NACL 20-0.9 MG/50ML-% IV SOLN
20.0000 mg | Freq: Two times a day (BID) | INTRAVENOUS | Status: AC
  Administered 2015-04-18 (×2): 20 mg via INTRAVENOUS
  Filled 2015-04-18: qty 50

## 2015-04-18 MED ORDER — ETOMIDATE 2 MG/ML IV SOLN
INTRAVENOUS | Status: AC
  Filled 2015-04-18: qty 10

## 2015-04-18 MED ORDER — PROTAMINE SULFATE 10 MG/ML IV SOLN
INTRAVENOUS | Status: DC | PRN
  Administered 2015-04-18: 350 mg via INTRAVENOUS

## 2015-04-18 MED ORDER — SODIUM CHLORIDE 0.45 % IV SOLN
INTRAVENOUS | Status: DC | PRN
  Administered 2015-04-18: 04:00:00 via INTRAVENOUS

## 2015-04-18 MED ORDER — MORPHINE SULFATE (PF) 2 MG/ML IV SOLN: 2.0000 mg | INTRAVENOUS | Status: DC | PRN

## 2015-04-18 MED ORDER — DOPAMINE-DEXTROSE 3.2-5 MG/ML-% IV SOLN: 0.0000 ug/kg/min | INTRAVENOUS | Status: DC

## 2015-04-18 MED ORDER — HEPARIN SODIUM (PORCINE) 1000 UNIT/ML IJ SOLN
INTRAMUSCULAR | Status: AC
  Filled 2015-04-18: qty 1

## 2015-04-18 MED ORDER — DEXMEDETOMIDINE HCL IN NACL 200 MCG/50ML IV SOLN
INTRAVENOUS | Status: AC
  Filled 2015-04-18: qty 50

## 2015-04-18 MED ORDER — LACTATED RINGERS IV SOLN
INTRAVENOUS | Status: DC
  Administered 2015-04-18: 04:00:00 via INTRAVENOUS

## 2015-04-18 MED ORDER — SODIUM CHLORIDE 0.9 % IV SOLN: 250.0000 mL | INTRAVENOUS | Status: DC

## 2015-04-18 MED ORDER — ASPIRIN EC 325 MG PO TBEC
325.0000 mg | DELAYED_RELEASE_TABLET | Freq: Every day | ORAL | Status: DC
  Administered 2015-04-19 – 2015-04-24 (×6): 325 mg via ORAL
  Filled 2015-04-18 (×6): qty 1

## 2015-04-18 MED ORDER — ALBUMIN HUMAN 5 % IV SOLN
INTRAVENOUS | Status: AC
  Filled 2015-04-18: qty 250

## 2015-04-18 MED ORDER — MIDAZOLAM HCL 2 MG/2ML IJ SOLN: 2.0000 mg | INTRAMUSCULAR | Status: DC | PRN

## 2015-04-18 MED ORDER — OXYCODONE HCL 5 MG PO TABS
5.0000 mg | ORAL_TABLET | ORAL | Status: DC | PRN
  Administered 2015-04-18: 10 mg via ORAL
  Administered 2015-04-18: 5 mg via ORAL
  Administered 2015-04-18 – 2015-04-24 (×28): 10 mg via ORAL
  Filled 2015-04-18 (×5): qty 2
  Filled 2015-04-18: qty 1
  Filled 2015-04-18 (×24): qty 2

## 2015-04-18 MED ORDER — ACETAMINOPHEN 650 MG RE SUPP: 650.0000 mg | Freq: Once | RECTAL | Status: AC

## 2015-04-18 MED ORDER — FENTANYL CITRATE (PF) 250 MCG/5ML IJ SOLN
INTRAMUSCULAR | Status: AC
  Filled 2015-04-18: qty 5

## 2015-04-18 MED ORDER — BISACODYL 10 MG RE SUPP: 10.0000 mg | Freq: Every day | RECTAL | Status: DC

## 2015-04-18 MED ORDER — CETYLPYRIDINIUM CHLORIDE 0.05 % MT LIQD
7.0000 mL | Freq: Two times a day (BID) | OROMUCOSAL | Status: DC
  Administered 2015-04-18 – 2015-04-24 (×10): 7 mL via OROMUCOSAL

## 2015-04-18 MED ORDER — ONDANSETRON HCL 4 MG/2ML IJ SOLN
4.0000 mg | Freq: Four times a day (QID) | INTRAMUSCULAR | Status: DC | PRN
  Administered 2015-04-19 – 2015-04-20 (×3): 4 mg via INTRAVENOUS
  Filled 2015-04-18 (×3): qty 2

## 2015-04-18 MED ORDER — MORPHINE SULFATE (PF) 2 MG/ML IV SOLN
1.0000 mg | INTRAVENOUS | Status: DC | PRN
  Administered 2015-04-18: 2 mg via INTRAVENOUS

## 2015-04-18 MED ORDER — TRAMADOL HCL 50 MG PO TABS
50.0000 mg | ORAL_TABLET | ORAL | Status: DC | PRN
  Administered 2015-04-18 – 2015-04-20 (×3): 100 mg via ORAL
  Filled 2015-04-18 (×3): qty 2

## 2015-04-18 MED ORDER — PHENYLEPHRINE HCL 10 MG/ML IJ SOLN
0.0000 ug/min | INTRAVENOUS | Status: DC
  Administered 2015-04-18: 50 ug/min via INTRAVENOUS
  Filled 2015-04-18: qty 2

## 2015-04-18 MED ORDER — DOCUSATE SODIUM 100 MG PO CAPS
200.0000 mg | ORAL_CAPSULE | Freq: Every day | ORAL | Status: DC
  Administered 2015-04-19 – 2015-04-24 (×6): 200 mg via ORAL
  Filled 2015-04-18 (×6): qty 2

## 2015-04-18 MED ORDER — MILRINONE IN DEXTROSE 20 MG/100ML IV SOLN
INTRAVENOUS | Status: DC | PRN
  Administered 2015-04-18: .2 ug/kg/min via INTRAVENOUS

## 2015-04-18 MED ORDER — INSULIN ASPART 100 UNIT/ML ~~LOC~~ SOLN
0.0000 [IU] | SUBCUTANEOUS | Status: DC
  Administered 2015-04-18 (×3): 2 [IU] via SUBCUTANEOUS
  Administered 2015-04-19 (×2): 4 [IU] via SUBCUTANEOUS

## 2015-04-18 MED ORDER — BISACODYL 5 MG PO TBEC
10.0000 mg | DELAYED_RELEASE_TABLET | Freq: Every day | ORAL | Status: DC
  Administered 2015-04-19 – 2015-04-24 (×6): 10 mg via ORAL
  Filled 2015-04-18 (×6): qty 2

## 2015-04-18 MED ORDER — PROTAMINE SULFATE 10 MG/ML IV SOLN
INTRAVENOUS | Status: AC
  Filled 2015-04-18: qty 25

## 2015-04-18 MED ORDER — SODIUM CHLORIDE 0.9 % IJ SOLN: 3.0000 mL | INTRAMUSCULAR | Status: DC | PRN

## 2015-04-18 MED ORDER — INSULIN REGULAR BOLUS VIA INFUSION
0.0000 [IU] | Freq: Three times a day (TID) | INTRAVENOUS | Status: DC
  Filled 2015-04-18: qty 10

## 2015-04-18 MED ORDER — KETOROLAC TROMETHAMINE 15 MG/ML IJ SOLN
15.0000 mg | Freq: Once | INTRAMUSCULAR | Status: AC
  Administered 2015-04-18: 15 mg via INTRAVENOUS
  Filled 2015-04-18: qty 1

## 2015-04-18 MED ORDER — DEXTROSE 5 % IV SOLN
1.5000 g | Freq: Two times a day (BID) | INTRAVENOUS | Status: AC
  Administered 2015-04-18 – 2015-04-19 (×4): 1.5 g via INTRAVENOUS
  Filled 2015-04-18 (×4): qty 1.5

## 2015-04-18 MED ORDER — ACETAMINOPHEN 500 MG PO TABS
1000.0000 mg | ORAL_TABLET | Freq: Four times a day (QID) | ORAL | Status: AC
  Administered 2015-04-18 – 2015-04-23 (×19): 1000 mg via ORAL
  Filled 2015-04-18 (×16): qty 2

## 2015-04-18 MED ORDER — SODIUM CHLORIDE 0.9 % IV SOLN
INTRAVENOUS | Status: DC
  Administered 2015-04-18: 04:00:00 via INTRAVENOUS

## 2015-04-18 MED ORDER — ROCURONIUM BROMIDE 50 MG/5ML IV SOLN
INTRAVENOUS | Status: AC
  Filled 2015-04-18: qty 3

## 2015-04-18 MED ORDER — PANTOPRAZOLE SODIUM 40 MG PO TBEC
40.0000 mg | DELAYED_RELEASE_TABLET | Freq: Every day | ORAL | Status: DC
  Administered 2015-04-20 – 2015-04-24 (×5): 40 mg via ORAL
  Filled 2015-04-18 (×5): qty 1

## 2015-04-18 MED ORDER — VANCOMYCIN HCL IN DEXTROSE 1-5 GM/200ML-% IV SOLN
1000.0000 mg | Freq: Once | INTRAVENOUS | Status: AC
  Administered 2015-04-18: 1000 mg via INTRAVENOUS
  Filled 2015-04-18: qty 200

## 2015-04-18 MED ORDER — METOPROLOL TARTRATE 1 MG/ML IV SOLN
2.5000 mg | INTRAVENOUS | Status: DC | PRN
Start: 2015-04-18 — End: 2015-04-24

## 2015-04-18 MED ORDER — NITROGLYCERIN IN D5W 200-5 MCG/ML-% IV SOLN: 0.0000 ug/min | INTRAVENOUS | Status: DC

## 2015-04-18 MED ORDER — CALCIUM CHLORIDE 10 % IV SOLN
1.0000 g | Freq: Once | INTRAVENOUS | Status: AC
  Administered 2015-04-18: 1 g via INTRAVENOUS

## 2015-04-18 MED ORDER — MILRINONE IN DEXTROSE 20 MG/100ML IV SOLN
0.2000 ug/kg/min | INTRAVENOUS | Status: DC
  Filled 2015-04-18: qty 100

## 2015-04-18 MED ORDER — POTASSIUM CHLORIDE 10 MEQ/50ML IV SOLN: 10.0000 meq | INTRAVENOUS | Status: AC

## 2015-04-18 MED ORDER — VECURONIUM BROMIDE 10 MG IV SOLR
INTRAVENOUS | Status: AC
  Filled 2015-04-18: qty 10

## 2015-04-18 MED ORDER — ANTISEPTIC ORAL RINSE SOLUTION (CORINZ)
7.0000 mL | Freq: Four times a day (QID) | OROMUCOSAL | Status: DC
  Administered 2015-04-18 (×2): 7 mL via OROMUCOSAL

## 2015-04-18 MED ORDER — INFLUENZA VAC SPLIT QUAD 0.5 ML IM SUSY: 0.5000 mL | PREFILLED_SYRINGE | INTRAMUSCULAR | Status: DC | PRN

## 2015-04-18 NOTE — Progress Notes (Addendum)
Patient Name: Lonnie Sullivan Date of Encounter: 04/18/2015    SUBJECTIVE: Intubated but arousable.  TELEMETRY:  Sinus rhythm Filed Vitals:   04/18/15 0930 04/18/15 0945 04/18/15 1000 04/18/15 1015  BP: 94/61  85/67   Pulse: 84 87 80 85  Temp: 100 F (37.8 C) 100 F (37.8 C) 100.2 F (37.9 C) 100.2 F (37.9 C)  TempSrc:      Resp: 18 18 15 24   Height:      Weight:      SpO2: 96% 96% 96% 97%    Intake/Output Summary (Last 24 hours) at 04/18/15 1024 Last data filed at 04/18/15 1001  Gross per 24 hour  Intake 6037.26 ml  Output   2510 ml  Net 3527.26 ml   LABS: Basic Metabolic Panel:  Recent Labs  04/17/15 2009  04/18/15 0346 04/18/15 0353 04/18/15 0915 04/18/15 0917  NA 138  < > 139 140  --  140  K 4.0  < > 4.8 4.6  --  4.8  CL 102  < > 109  --   --  105  CO2 24  --  27  --   --   --   GLUCOSE 172*  < > 117* 110*  --  139*  BUN 11  < > 12  --   --  15  CREATININE 1.10  < > 1.07  --  1.19 1.10  CALCIUM 10.1  --  11.8*  --   --   --   MG  --   --  2.1  --  3.4*  --   < > = values in this interval not displayed. CBC:  Recent Labs  04/17/15 2009  04/18/15 0346  04/18/15 0915 04/18/15 0917  WBC 16.6*  < > 26.0*  --  18.4*  --   NEUTROABS 10.9*  --   --   --   --   --   HGB 14.1  < > 12.7*  < > 11.2* 11.2*  HCT 41.9  < > 38.5*  < > 34.9* 33.0*  MCV 87.7  < > 88.9  --  89.5  --   PLT 359  < > 232  --  214  --   < > = values in this interval not displayed. Cardiac Enzymes:  Recent Labs  04/18/15 0346  CKTOTAL 4410*  CKMB >260.0*   ECG: Persistent inferior ST elevation with sinus tachycardia.   Radiology/Studies:  Support data based on chest x-ray  Physical Exam: Blood pressure 85/67, pulse 85, temperature 100.2 F (37.9 C), temperature source Core (Comment), resp. rate 24, height 5\' 10"  (1.778 m), weight 311 lb 11.7 oz (141.4 kg), SpO2 97 %. Weight change:   Wt Readings from Last 3 Encounters:  04/18/15 311 lb 11.7 oz (141.4 kg)     Regular rhythm. Support apparatus heard. Lungs clear anteriorly Extremities reveal mild edema Right radial cath site unremarkable No femoral hematoma at balloon pump site  ASSESSMENT:  1. Acute inferior infarct with concomitant tight distal left main requiring emergency coronary bypass grafting with LIMA to LAD, SVG to PDA, SVG to OM. 2. Obesity 3. Hyperlipidemia 4. Undiagnosed diabetes mellitus  Plan:  1. High-dose statin therapy 2. Initiate beta blocker therapy when appropriate 3. Glycemic control 4. Assess LV function. By TEE doing surgery EF 35-40%. We'll plan repeat echo later during this admission are early post discharge to reassess LV function with improved myocardial perfusion. 5. Dual antiplatelet therapy  to include Plavix when safe. DAPT for at least one year followed by aspirin alone.  Demetrios Isaacs 04/18/2015, 10:24 AM

## 2015-04-18 NOTE — Plan of Care (Signed)
Continues to complain about right shoulder pain, states arm and hand is numb. ROM by patient, pain medication given. Good cap refill and radial pulse 2+. Will continue to assess. Elevated on pillow.

## 2015-04-18 NOTE — Progress Notes (Signed)
[address]       [place] [address]             [phone]        CARDIOTHORACIC SURGERY PROGRESS NOTE   R1 Day Post-Op Procedure(s) (LRB): CORONARY ARTERY BYPASS GRAFTING (CABG)x 3 using left internal mammory artery and right saphenous leg vein harvested endoscopically  (N/A)  Subjective: Awake and alert on vent.  Neuro intact. Wants ET tube out.  Complains of pain right shoulder but looks very comfortable.  Objective: Vital signs: BP Readings from Last 1 Encounters:  04/18/15 94/61   Pulse Readings from Last 1 Encounters:  04/18/15 87   Resp Readings from Last 1 Encounters:  04/18/15 18   Temp Readings from Last 1 Encounters:  04/18/15 100 F (37.8 C)     Hemodynamics: PAP: (24-49)/(15-32) 28/18 mmHg CO:  [4.6 L/min-6.4 L/min] 5.6 L/min CI:  [1.8 L/min/m2-2.5 L/min/m2] 2.1 L/min/m2  Physical Exam:  Rhythm:   sinus  Breath sounds: Coarse but clear  Heart sounds:  RRR  Incisions:  Dressing dry, intact  Abdomen:  Soft, non-distended, non-tender  Extremities:  Warm, well-perfused  Chest tubes:  Low volume thin serosanguinous output, no air leak    Intake/Output from previous day: 12/03 0701 - 12/04 0700 In: 5100 [I.V.:3490; Blood:700; IV Piggyback:910] Out: 2245 [Urine:2205; Chest Tube:40] Intake/Output this shift: Total I/O In: 739.3 [I.V.:399.3; IV Piggyback:340] Out: 160 [Urine:110; Chest Tube:50]  Lab Results:  CBC: Recent Labs  04/18/15 0346  04/18/15 0915 04/18/15 0917  WBC 26.0*  --  18.4*  --   HGB 12.7*  < > 11.2* 11.2*  HCT 38.5*  < > 34.9* 33.0*  PLT 232  --  214  --   < > = values in this interval not displayed.  BMET:  Recent Labs  04/17/15 2009  04/18/15 0346 04/18/15 0353 04/18/15 0915 04/18/15 0917  NA 138  < > 139 140  --  140  K 4.0  < > 4.8 4.6  --  4.8  CL 102  < > 109  --   --  105  CO2 24  --  27  --   --   --   GLUCOSE 172*  < > 117* 110*  --  139*  BUN 11  < > 12  --   --  15    CREATININE 1.10  < > 1.07  --  1.19 1.10  CALCIUM 10.1  --  11.8*  --   --   --   < > = values in this interval not displayed.   PT/INR:   Recent Labs  04/18/15 0346  LABPROT 17.7*  INR 1.45    CBG (last 3)   Recent Labs  04/18/15 0507 04/18/15 0610 04/18/15 0656  GLUCAP 98 118* 119*    ABG    Component Value Date/Time   PHART 7.315* 04/18/2015 0538   PCO2ART 50.3* 04/18/2015 0538   PO2ART 169.0* 04/18/2015 0538   HCO3 25.3* 04/18/2015 0538   TCO2 24 04/18/2015 0917   ACIDBASEDEF 1.0 04/18/2015 0538   O2SAT 99.0 04/18/2015 0538    CXR: PORTABLE CHEST 1 VIEW  COMPARISON: None.  FINDINGS: Endotracheal tube is 4.6 cm above the carina. Nasogastric tube extends into the stomach. Right jugular Swan-Ganz catheter extends into the RV outflow tract. Mediastinal drains present. Left chest tube present.  No pneumothorax. There is curvilinear atelectasis in the central and basilar lungs, left greater than right. No  large effusion. Mild atelectatic appearing airspace opacity in the medial left base.  IMPRESSION: Support equipment appears satisfactorily positioned.  Mild atelectatic appearing basilar opacities. No pneumothorax.   Electronically Signed  By: Andreas Newport M.D.  On: 04/18/2015 04:13    Assessment/Plan: S/P Procedure(s) (LRB): CORONARY ARTERY BYPASS GRAFTING (CABG)x 3 using left internal mammory artery and right saphenous leg vein harvested endoscopically  (N/A)  Overall stable s/p emergency CABG for acute inferior STEMI with critical left main and 3-vessel CAD Maintaining NSR w/ stable hemodynamics and low PA pressures w/ IABP 1:1 milrinone @ 0.2 dopamine @3  and Neo @ 50 O2 sats 95-96% on 40% FiO2, CXR looks good Acute systolic CHF with expected post-op volume excess Expected post op acute blood loss anemia, mild Type II diabetes mellitus, newly-diagnosed,  excellent glycemic control but requiring high dose insulin drip Lipid  status unknown Morbid obesity   Wean vent to extubate  D/C right radial sheath from cath lab  Leave IABP today and possibly wean to remove tomorrow  Add levemir and wean insulin  Statin once taking po's   Rexene Alberts, MD 04/18/2015 10:00 AM

## 2015-04-18 NOTE — Progress Notes (Signed)
6Fr radial sheath aspirated and removed from right radial artery. Tr band applied. Tr band inflatin 11cc at 11:20.  No bleeding or hematoma present, site level 0. Spo2 as measured on right hand is 95%. Patient intubated but responds / nods to bedrest/ right wrist immobilization instructions.

## 2015-04-18 NOTE — Anesthesia Postprocedure Evaluation (Signed)
Anesthesia Post Note  Patient: HAWKE UELAND  Procedure(s) Performed: Procedure(s) (LRB): CORONARY ARTERY BYPASS GRAFTING (CABG)x 3 using left internal mammory artery and right saphenous leg vein harvested endoscopically  (N/A)  Patient location during evaluation: SICU Anesthesia Type: General Level of consciousness: sedated Pain management: pain level controlled Vital Signs Assessment: post-procedure vital signs reviewed and stable Respiratory status: patient remains intubated per anesthesia plan Cardiovascular status: stable Anesthetic complications: no    Last Vitals:  Filed Vitals:   04/18/15 0600 04/18/15 0615  BP: 83/50 75/53  Pulse: 99 96  Temp: 37.9 C 38 C  Resp: 18 13    Last Pain:  Filed Vitals:   04/18/15 0622  PainSc: 5                  Beaulah Romanek DANIEL

## 2015-04-18 NOTE — Progress Notes (Signed)
abg collected. FiO2 titrated according to abg results. RN aware

## 2015-04-18 NOTE — Brief Op Note (Addendum)
04/17/2015  1:23 AM  PATIENT:  Lonnie Sullivan  49 y.o. male  PRE-OPERATIVE DIAGNOSIS:  1. S/p STEMI 2. 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 S/p STEMI 2. 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)  PROCEDURE:  TRANSESOPHAGEAL ECHOCARDIOGRAM (TEE), EMERGENT MEDIAN STERNOTOMY for CORONARY ARTERY BYPASS GRAFTING (CABG) x 3 (LIMA to LAD, SVG to OM, SVG to PDA) with EVH from right thigh and partial lower leg  SURGEON:  Surgeon(s) and Role:    * Rexene Alberts, MD - Primary  PHYSICIAN ASSISTANT: Lars Pinks PA-C  ASSISTANTS: Stacy Gardner RNFA  ANESTHESIA:   Duane Boston, MD  CROSSCLAMP TIME:   90'  CARDIOPULMONARY BYPASS TIME: 113'  FINDINGS:  Severe inferior wall and RV hypokinesis, severe septal hypokinesis, moderate global hypokinesis  Good quality LIMA conduit for grafting  Good quality SVG conduit for grafting  Good quality target vessels for grafting  COMPLICATIONS: None  BASELINE WEIGHT: 118 kg  PATIENT DISPOSITION:   TO SICU IN STABLE CONDITION  Rexene Alberts, MD 04/18/2015 3:00 AM

## 2015-04-18 NOTE — Progress Notes (Signed)
[facility] BRIEF SICU PROGRESS NOTE  1 Day Post-Op  S/P Procedure(s) (LRB): CORONARY ARTERY BYPASS GRAFTING (CABG)x 3 using left internal mammory artery and right saphenous leg vein harvested endoscopically  (N/A)   Extubated uneventfully, breathing comfortably Anxious and complaining of pain in right shoulder that seems positional NSR - sinus tach w/ stable hemodynamics off milrinone, on low dose Neo and dopamine with IABP 1:1 UOP adequate Evening labs pending  Plan: Will continue Precedex for anxiety.  Wean IABP with hope to remove it tomorrow  Rexene Alberts, MD 04/18/2015 7:53 PM

## 2015-04-18 NOTE — Transfer of Care (Signed)
Immediate Anesthesia Transfer of Care Note  Patient: Lonnie Sullivan  Procedure(s) Performed: Procedure(s): CORONARY ARTERY BYPASS GRAFTING (CABG)x 3 using left internal mammory artery and right saphenous leg vein harvested endoscopically  (N/A)  Patient Location: SICU  Anesthesia Type:General  Level of Consciousness: Patient remains intubated per anesthesia plan  Airway & Oxygen Therapy: Patient remains intubated per anesthesia plan and Patient placed on Ventilator (see vital sign flow sheet for setting)  Post-op Assessment: Report given to RN and Post -op Vital signs reviewed and stable  Post vital signs: Reviewed and stable  Last Vitals:  Filed Vitals:   04/17/15 2158 04/17/15 2203  BP:    Pulse: 295 288  Temp:    Resp: 0 0    Complications: No apparent anesthesia complications

## 2015-04-18 NOTE — Op Note (Signed)
CARDIOTHORACIC SURGERY OPERATIVE NOTE  Date of Procedure: 04/18/2015  Preoperative Diagnosis:   Critical Left Main and 3-vessel Coronary Artery Disease  Acute Inferior Wall ST-segment Elevation Myocardial Infarction  Postoperative Diagnosis: Same  Procedure:    Emergency Coronary Artery Bypass Grafting x 3   Left Internal Mammary Artery to Distal Left Anterior Descending Coronary Artery  Saphenous Vein Graft to Posterior Descending Coronary Artery  Saphenous Vein Graft to Obtuse Marginal Branch of Left Circumflex Coronary Artery  Endoscopic Vein Harvest from Right Thigh  Surgeon: Valentina Gu. Roxy Manns, MD  Assistant: Nani Skillern, PA-C  Anesthesia: Duane Boston, MD  Operative Findings:  Severe inferior wall and RV hypokinesis, severe septal hypokinesis, moderate global hypokinesis  Good quality LIMA conduit for grafting  Good quality SVG conduit for grafting  Good quality target vessels for grafting    BRIEF CLINICAL NOTE AND INDICATIONS FOR SURGERY  Patient is a 49 year old male with no previous history of coronary artery disease. Risk factors are only notable for a strong family history of premature coronary artery disease. The patient's lipid status is unknown. He describes 4-6 month history of progressive symptoms of exertional substernal chest discomfort and shortness of breath. Several weeks ago he had a single episode of severe substernal chest pain associated with shortness of breath that lasted approximately 2-3 hours. He did not seek medical attention at that time. Yesterday evening the patient developed substernal chest pain associated with shortness of breath. Symptoms waxed and waned overnight and eased up earlier this morning. Proximally 2 PM this afternoon the patient developed recurrent severe substernal chest pain. He presented to the emergency department where baseline EKG revealed acute inferior ST segment elevation with posterior lateral ST segment  depression. Code STEMI was called and the patient was brought directly to the Cath Lab by Dr. Angelena Form. Catheterization demonstrates critical distal left main coronary artery disease with acute thrombotic occlusion of the right coronary artery. An attempt at primary balloon angioplasty of the right coronary artery was performed although only minimal antegrade flow could be reestablished. Emergency [facility] thoracic surgical consultation was requested. Intra-aortic balloon pump was placed in the Cath Lab.  The patient has been seen in consultation and counseled at length regarding the indications, risks and potential benefits of surgery.  All questions have been answered, and the patient provides full informed consent for the operation as described.    DETAILS OF THE OPERATIVE PROCEDURE  Preparation:  The patient is brought to the operating room on the above mentioned date and central monitoring was established by the anesthesia team including placement of Swan-Ganz catheter and radial arterial line. The patient is placed in the supine position on the operating table.  Intravenous antibiotics are administered. General endotracheal anesthesia is induced uneventfully. A Foley catheter is placed.  Baseline transesophageal echocardiogram was performed.  Findings were notable for severe inferior wall and RV hypokinesis, severe hypokinesis of the interventricular septum, and moderate global hypokinesis.  There was mild mitral regurgitation.  The patient's chest, abdomen, both groins, and both lower extremities are prepared and draped in a sterile manner. A time out procedure is performed.   Surgical Approach and Conduit Harvest:  A median sternotomy incision was performed and the left internal mammary artery is dissected from the chest wall and prepared for bypass grafting. The left internal mammary artery is notably good quality conduit. Simultaneously, the greater saphenous vein is obtained from the  patient's right thigh using endoscopic vein harvest technique. The saphenous vein is notably good quality  conduit. After removal of the saphenous vein, the small surgical incisions in the lower extremity are closed with absorbable suture. Following systemic heparinization, the left internal mammary artery was transected distally noted to have excellent flow.   Extracorporeal Cardiopulmonary Bypass and Myocardial Protection:  The pericardium is opened. The ascending aorta is normal in appearance. The ascending aorta and the right atrium are cannulated for cardiopulmonary bypass.  Adequate heparinization is verified.    A retrograde cardioplegia cannula is placed through the right atrium into the coronary sinus.  The entire pre-bypass portion of the operation was notable for stable hemodynamics.  Cardiopulmonary bypass was begun and the surface of the heart is inspected. Distal target vessels are selected for coronary artery bypass grafting. A cardioplegia cannula is placed in the ascending aorta.  A temperature probe was placed in the interventricular septum.  The patient is allowed to cool passively to [facility] systemic temperature.  The aortic cross clamp is applied and cold blood cardioplegia is delivered initially in an antegrade fashion through the aortic root.   Supplemental cardioplegia is given retrograde through the coronary sinus catheter.  Iced saline slush is applied for topical hypothermia.  The initial cardioplegic arrest is rapid with early diastolic arrest.  Repeat doses of cardioplegia are administered intermittently throughout the entire cross clamp portion of the operation through the aortic root, through the coronary sinus catheter, and through subsequently placed vein grafts in order to maintain completely flat electrocardiogram and septal myocardial temperature below 15C.  Myocardial protection was felt to be excellent.  Coronary Artery Bypass Grafting:   The posterior descending  branch of the right coronary artery was grafted using a reversed saphenous vein graft in an end-to-side fashion.  At the site of distal anastomosis the target vessel was good quality and measured approximately 2.0 mm in diameter.  The obtuse marginal branch of the left circumflex coronary artery was grafted using a reversed saphenous vein graft in an end-to-side fashion.  At the site of distal anastomosis the target vessel was good quality and measured approximately 2.2 mm in diameter.  The distal left anterior coronary artery was grafted with the left internal mammary artery in an end-to-side fashion.  At the site of distal anastomosis the target vessel was good quality and measured approximately 2.0 mm in diameter.  All proximal vein graft anastomoses were placed directly to the ascending aorta prior to removal of the aortic cross clamp.  The septal myocardial temperature rose rapidly after reperfusion of the left internal mammary artery graft.  The aortic cross clamp was removed after a total cross clamp time of 79 minutes.   Procedure Completion:  All proximal and distal coronary anastomoses were inspected for hemostasis and appropriate graft orientation. Epicardial pacing wires are fixed to the right ventricular outflow tract and to the right atrial appendage. The patient is rewarmed to 37C temperature. The patient is weaned and disconnected from cardiopulmonary bypass.  The patient's rhythm at separation from bypass was sinus.  The patient was weaned from cardiopulmonary bypass on low dose milrinone and dopamine infusions with intra-aortic balloon pump counterpulsation at 1:1. Total cardiopulmonary bypass time for the operation was 113 minutes.  Followup transesophageal echocardiogram performed after separation from bypass revealed somewhat improved LV function.  The aortic and venous cannula were removed uneventfully. Protamine was administered to reverse the anticoagulation. The mediastinum  and pleural space were inspected for hemostasis and irrigated with saline solution. The mediastinum and the left pleural space were drained using 3 chest tubes  placed through separate stab incisions inferiorly.  The soft tissues anterior to the aorta were reapproximated loosely. The sternum is closed with double strength sternal wire. The soft tissues anterior to the sternum were closed in multiple layers and the skin is closed with a running subcuticular skin closure.  The post-bypass portion of the operation was notable for stable rhythm and hemodynamics.  No blood products were administered during the operation.   Disposition:  The patient tolerated the procedure well and is transported to the surgical intensive care in stable condition. There are no intraoperative complications. All sponge instrument and needle counts are verified correct at completion of the operation.    Valentina Gu. Roxy Manns MD 04/18/2015 3:06 AM

## 2015-04-18 NOTE — Progress Notes (Signed)
Utilization Review Completed.Lonnie Sullivan T12/08/2014  

## 2015-04-18 NOTE — Procedures (Signed)
Extubation Procedure Note  Patient Details:   Name: Lonnie Sullivan DOB: 05-10-1966 MRN: [id number]   Airway Documentation:     Evaluation  O2 sats: stable throughout Complications: No apparent complications Patient did tolerate procedure well. Bilateral Breath Sounds: Clear Suctioning: Oral, Airway Yes   Positive cuff leak, acceptable NIF and VC, RN at bedside.  [place] R Lark Langenfeld 04/18/2015, 11:40 AM

## 2015-04-18 NOTE — Progress Notes (Signed)
EKG CRITICAL VALUE     12 lead EKG performed.  Critical value noted.  Eustace Quail, RN notified.   Neva Seat, CCT 04/18/2015 7:24 AM

## 2015-04-19 ENCOUNTER — Encounter (HOSPITAL_COMMUNITY): Payer: Self-pay | Admitting: Cardiovascular Disease

## 2015-04-19 ENCOUNTER — Inpatient Hospital Stay (HOSPITAL_COMMUNITY): Payer: Self-pay

## 2015-04-19 LAB — POCT I-STAT, CHEM 8
BUN: 32 mg/dL — AB (ref 6–20)
CALCIUM ION: 1.18 mmol/L (ref 1.12–1.23)
CREATININE: 1.4 mg/dL — AB (ref 0.61–1.24)
Chloride: 99 mmol/L — ABNORMAL LOW (ref 101–111)
Glucose, Bld: 158 mg/dL — ABNORMAL HIGH (ref 65–99)
HEMATOCRIT: 34 % — AB (ref 39.0–52.0)
Hemoglobin: 11.6 g/dL — ABNORMAL LOW (ref 13.0–17.0)
Potassium: 5.1 mmol/L (ref 3.5–5.1)
Sodium: 133 mmol/L — ABNORMAL LOW (ref 135–145)
TCO2: 23 mmol/L (ref 0–100)

## 2015-04-19 LAB — LIPID PANEL
CHOLESTEROL: 99 mg/dL (ref 0–200)
HDL: 25 mg/dL — ABNORMAL LOW (ref >40)
LDL CALC: 56 mg/dL (ref 0–99)
TRIGLYCERIDES: 88 mg/dL (ref <150)
Total CHOL/HDL Ratio: 4 RATIO
VLDL: 18 mg/dL (ref 0–40)

## 2015-04-19 LAB — GLUCOSE, CAPILLARY
GLUCOSE-CAPILLARY: 156 mg/dL — AB (ref 65–99)
GLUCOSE-CAPILLARY: 161 mg/dL — AB (ref 65–99)
Glucose-Capillary: 185 mg/dL — ABNORMAL HIGH (ref 65–99)
Glucose-Capillary: 172 mg/dL — ABNORMAL HIGH (ref 65–99)
Glucose-Capillary: 147 mg/dL — ABNORMAL HIGH (ref 65–99)
Glucose-Capillary: 184 mg/dL — ABNORMAL HIGH (ref 65–99)

## 2015-04-19 LAB — BLOOD GAS, ARTERIAL
ACID-BASE DEFICIT: 4.1 mmol/L — AB (ref 0.0–2.0)
BICARBONATE: 20.4 meq/L (ref 20.0–24.0)
DRAWN BY: 362771
O2 CONTENT: 5 L/min
O2 Saturation: 94.2 %
PATIENT TEMPERATURE: 98.6
PH ART: 7.356 (ref 7.350–7.450)
TCO2: 21.6 mmol/L (ref 0–100)
pCO2 arterial: 37.5 mmHg (ref 35.0–45.0)
pO2, Arterial: 74.8 mmHg — ABNORMAL LOW (ref 80.0–100.0)

## 2015-04-19 LAB — CBC
HCT: 33 % — ABNORMAL LOW (ref 39.0–52.0)
HCT: 32 % — ABNORMAL LOW (ref 39.0–52.0)
HEMOGLOBIN: 10.9 g/dL — AB (ref 13.0–17.0)
Hemoglobin: 10.8 g/dL — ABNORMAL LOW (ref 13.0–17.0)
MCH: 29.8 pg (ref 26.0–34.0)
MCH: 30.1 pg (ref 26.0–34.0)
MCHC: 33 g/dL (ref 30.0–36.0)
MCHC: 33.8 g/dL (ref 30.0–36.0)
MCV: 90.2 fL (ref 78.0–100.0)
MCV: 89.1 fL (ref 78.0–100.0)
PLATELETS: 184 10*3/uL (ref 150–400)
PLATELETS: 173 10*3/uL (ref 150–400)
RBC: 3.66 MIL/uL — AB (ref 4.22–5.81)
RBC: 3.59 MIL/uL — AB (ref 4.22–5.81)
RDW: 13.4 % (ref 11.5–15.5)
RDW: 13.5 % (ref 11.5–15.5)
WBC: 21.6 10*3/uL — AB (ref 4.0–10.5)
WBC: 22.7 10*3/uL — AB (ref 4.0–10.5)

## 2015-04-19 LAB — APTT: aPTT: 31 seconds (ref 24–37)

## 2015-04-19 LAB — POCT I-STAT 3, ART BLOOD GAS (G3+)
Acid-base deficit: 13 mmol/L — ABNORMAL HIGH (ref 0.0–2.0)
BICARBONATE: 14 meq/L — AB (ref 20.0–24.0)
O2 SAT: 91 %
PCO2 ART: 36.9 mmHg (ref 35.0–45.0)
TCO2: 15 mmol/L (ref 0–100)
pH, Arterial: 7.188 — CL (ref 7.350–7.450)
pO2, Arterial: 73 mmHg — ABNORMAL LOW (ref 80.0–100.0)

## 2015-04-19 LAB — BASIC METABOLIC PANEL
ANION GAP: 7 (ref 5–15)
BUN: 24 mg/dL — ABNORMAL HIGH (ref 6–20)
CALCIUM: 8.2 mg/dL — AB (ref 8.9–10.3)
CO2: 23 mmol/L (ref 22–32)
CREATININE: 1.55 mg/dL — AB (ref 0.61–1.24)
Chloride: 105 mmol/L (ref 101–111)
GFR, EST AFRICAN AMERICAN: 59 mL/min — AB (ref >60)
GFR, EST NON AFRICAN AMERICAN: 51 mL/min — AB (ref >60)
Glucose, Bld: 191 mg/dL — ABNORMAL HIGH (ref 65–99)
Potassium: 5.1 mmol/L (ref 3.5–5.1)
SODIUM: 135 mmol/L (ref 135–145)

## 2015-04-19 LAB — PROTIME-INR
INR: 1.4 (ref 0.00–1.49)
PROTHROMBIN TIME: 17.3 s — AB (ref 11.6–15.2)

## 2015-04-19 LAB — CREATININE, SERUM
CREATININE: 1.52 mg/dL — AB (ref 0.61–1.24)
GFR, EST NON AFRICAN AMERICAN: 52 mL/min — AB (ref >60)

## 2015-04-19 LAB — MAGNESIUM: MAGNESIUM: 2.6 mg/dL — AB (ref 1.7–2.4)

## 2015-04-19 LAB — POCT ACTIVATED CLOTTING TIME: Activated Clotting Time: 132 seconds

## 2015-04-19 MED ORDER — LIVING WELL WITH DIABETES BOOK
Freq: Once | Status: DC
  Filled 2015-04-19: qty 1

## 2015-04-19 MED ORDER — FUROSEMIDE 10 MG/ML IJ SOLN
20.0000 mg | Freq: Once | INTRAMUSCULAR | Status: AC
  Administered 2015-04-19: 20 mg via INTRAVENOUS
  Filled 2015-04-19: qty 2

## 2015-04-19 MED ORDER — INSULIN DETEMIR 100 UNIT/ML ~~LOC~~ SOLN
28.0000 [IU] | Freq: Two times a day (BID) | SUBCUTANEOUS | Status: DC
  Administered 2015-04-19 – 2015-04-21 (×6): 28 [IU] via SUBCUTANEOUS
  Filled 2015-04-19 (×7): qty 0.28

## 2015-04-19 MED ORDER — INSULIN ASPART 100 UNIT/ML ~~LOC~~ SOLN: 0.0000 [IU] | Freq: Every day | SUBCUTANEOUS | Status: DC

## 2015-04-19 MED ORDER — ENOXAPARIN SODIUM 30 MG/0.3ML ~~LOC~~ SOLN
30.0000 mg | Freq: Every day | SUBCUTANEOUS | Status: DC
  Administered 2015-04-19 – 2015-04-23 (×5): 30 mg via SUBCUTANEOUS
  Filled 2015-04-19 (×5): qty 0.3

## 2015-04-19 MED ORDER — INSULIN STARTER KIT- PEN NEEDLES (ENGLISH)
1.0000 | Freq: Once | Status: DC
  Filled 2015-04-19: qty 1

## 2015-04-19 MED ORDER — ATORVASTATIN CALCIUM 40 MG PO TABS
40.0000 mg | ORAL_TABLET | Freq: Every day | ORAL | Status: DC
  Administered 2015-04-19 – 2015-04-23 (×5): 40 mg via ORAL
  Filled 2015-04-19 (×5): qty 1

## 2015-04-19 MED ORDER — INSULIN ASPART 100 UNIT/ML ~~LOC~~ SOLN
0.0000 [IU] | Freq: Three times a day (TID) | SUBCUTANEOUS | Status: DC
  Administered 2015-04-19: 4 [IU] via SUBCUTANEOUS
  Administered 2015-04-19 – 2015-04-20 (×2): 3 [IU] via SUBCUTANEOUS
  Administered 2015-04-20: 4 [IU] via SUBCUTANEOUS
  Administered 2015-04-21 – 2015-04-23 (×2): 3 [IU] via SUBCUTANEOUS

## 2015-04-19 MED FILL — Magnesium Sulfate Inj 50%: INTRAMUSCULAR | Qty: 10 | Status: AC

## 2015-04-19 MED FILL — Mannitol IV Soln 20%: INTRAVENOUS | Qty: 500 | Status: AC

## 2015-04-19 MED FILL — Electrolyte-R (PH 7.4) Solution: INTRAVENOUS | Qty: 3000 | Status: AC

## 2015-04-19 MED FILL — Sodium Chloride IV Soln 0.9%: INTRAVENOUS | Qty: 2000 | Status: AC

## 2015-04-19 MED FILL — Potassium Chloride Inj 2 mEq/ML: INTRAVENOUS | Qty: 40 | Status: AC

## 2015-04-19 MED FILL — Sodium Bicarbonate IV Soln 8.4%: INTRAVENOUS | Qty: 50 | Status: AC

## 2015-04-19 MED FILL — Lidocaine HCl IV Inj 20 MG/ML: INTRAVENOUS | Qty: 5 | Status: AC

## 2015-04-19 MED FILL — Calcium Chloride Inj 10%: INTRAVENOUS | Qty: 10 | Status: AC

## 2015-04-19 MED FILL — Heparin Sodium (Porcine) Inj 1000 Unit/ML: INTRAMUSCULAR | Qty: 20 | Status: AC

## 2015-04-19 MED FILL — Heparin Sodium (Porcine) Inj 1000 Unit/ML: INTRAMUSCULAR | Qty: 30 | Status: AC

## 2015-04-19 NOTE — Progress Notes (Addendum)
[address]       [place] [address]             [phone]        CARDIOTHORACIC SURGERY PROGRESS NOTE   R2 Days Post-Op Procedure(s) (LRB): CORONARY ARTERY BYPASS GRAFTING (CABG)x 3 using left internal mammory artery and right saphenous leg vein harvested endoscopically  (N/A)  Subjective: Looks good.  Had a good night.  Mild soreness in chest and arms.  Breathing comfortably.  Objective: Vital signs: BP Readings from Last 1 Encounters:  04/19/15 101/84   Pulse Readings from Last 1 Encounters:  04/19/15 84   Resp Readings from Last 1 Encounters:  04/19/15 26   Temp Readings from Last 1 Encounters:  04/19/15 100 F (37.8 C)     Hemodynamics: PAP: (24-43)/(11-32) 32/26 mmHg CO:  [4.4 L/min-6.7 L/min] 4.9 L/min CI:  [1.7 L/min/m2-2.6 L/min/m2] 1.9 L/min/m2  Physical Exam:  Rhythm:   sinus  Breath sounds: clear  Heart sounds:  RRR  Incisions:  Dressing dry, intact  Abdomen:  Soft, non-distended, non-tender  Extremities:  Warm, well-perfused  Chest tubes:  Low volume thin serosanguinous output, no air leak    Intake/Output from previous day: 12/04 0701 - 12/05 0700 In: 4853.2 [P.O.:2100; I.V.:2113.2; IV Piggyback:640] Out: [id number] [Urine:925; Chest Tube:450] Intake/Output this shift:    Lab Results:  CBC: Recent Labs  04/18/15 0915 04/18/15 0917 04/19/15 0400  WBC 18.4*  --  21.6*  HGB 11.2* 11.2* 10.9*  HCT 34.9* 33.0* 33.0*  PLT 214  --  184    BMET:  Recent Labs  04/18/15 0346  04/18/15 0917 04/19/15 0400  NA 139  < > 140 135  K 4.8  < > 4.8 5.1  CL 109  --  105 105  CO2 27  --   --  23  GLUCOSE 117*  < > 139* 191*  BUN 12  --  15 24*  CREATININE 1.07  < > 1.10 1.55*  CALCIUM 11.8*  --   --  8.2*  < > = values in this interval not displayed.   PT/INR:   Recent Labs  04/19/15 0400  LABPROT 17.3*  INR 1.40    CBG (last 3)   Recent Labs  04/18/15 1930 04/18/15 2348 04/19/15 0339  GLUCAP 155* 156*  185*    ABG    Component Value Date/Time   PHART 7.356 04/19/2015 0410   PCO2ART 37.5 04/19/2015 0410   PO2ART 74.8* 04/19/2015 0410   HCO3 20.4 04/19/2015 0410   TCO2 21.6 04/19/2015 0410   ACIDBASEDEF 4.1* 04/19/2015 0410   O2SAT 94.2 04/19/2015 0410    CXR: PORTABLE CHEST 1 VIEW  COMPARISON: 04/18/2015 .  FINDINGS: Interim extubation removal of NG tube. Swan-Ganz catheter tip noted over the main pulmonary artery. Mediastinal drainage catheter stable position. Left chest tube in stable position. Prior CABG. Stable cardiomegaly. No pulmonary venous congestion. Bilateral subsegmental atelectasis again noted. No pleural effusion. No pneumothorax.  IMPRESSION: 1. Interim removal of endotracheal tube and NG tube. Swan-Ganz catheter tip noted projected over the main pulmonary artery. Mediastinal drainage catheter and left chest tube in stable position. No pneumothorax. 2. Prior CABG. Stable cardiomegaly. No pulmonary venous congestion. 3. Bilateral subsegmental atelectasis again noted .   Electronically Signed  By: Marcello Moores Register  On: 04/19/2015 07:18  Assessment/Plan: S/P Procedure(s) (LRB): CORONARY ARTERY BYPASS GRAFTING (CABG)x 3 using left internal mammory artery and right saphenous leg vein harvested endoscopically  (N/A)  Overall doing remarkably well POD2 Maintaining NSR w/ stable hemodynamics, off all drips, IABP @ 1:3 Breathing comfortably w/ O2 sats 94-96% on 3 L/min via [place] and CXR clear S/P acute inferior STEMI Expected post op acute blood loss anemia, mild, stable Acute systolic CHF with expected post-op volume excess, stable/improved Low-grade fevers and leukocytosis, likely reactive Morbid obesity Type II diabetes mellitus, newly-diagnosed, adequate glycemic control Acute kidney injury w/ creatinine up to 1.5 today, likely secondary to ATN and pre-renal azotemia due to acute MI and CABG   D/C IABP  Mobilize once IABP out  D/C chest tubes  and Swan-ganz  Watch renal function  Watch WBC and fevers, no indications for ABx at this time  Lovenox for DVT prophylaxis   Rexene Alberts, MD 04/19/2015 7:55 AM

## 2015-04-19 NOTE — Progress Notes (Signed)
Balloon pump and sheath removed at 0815. Manual pressure was held for 20 minutes. Pre balloon pump removal vital signs: HR-82, BP-96/65, O2 saturation-96% on room air, RR-24. Post balloon pump vital signs: HR-77, BP-98/60, O2 saturation-96% on room air, RR-26. Right pedal pulse +2, extremity color normal for ethnicity. Patient only complained of right shoulder pain. Post sheath pull, sight is a level 0. Gauze and tegaderm applied and patient was resting comfortably. Primary nurse notified.

## 2015-04-19 NOTE — Progress Notes (Signed)
Inpatient Diabetes Program Recommendations  AACE/ADA: New Consensus Statement on Inpatient Glycemic Control (2015)  Target Ranges:  Prepandial:   less than 140 mg/dL      Peak postprandial:   less than 180 mg/dL (1-2 hours)      Critically ill patients:  140 - 180 mg/dL   Review of Glycemic Control  Diabetes history: None Current orders for Inpatient glycemic control: Levemir 28 units BID, Novolog Resistant + HS scale  Inpatient Diabetes Program Recommendations: HgbA1C: In progress. Will wait on results in order to teach patient on possible discharge medications and lifestyle management.  Note: ordered the DM educational booklet/educational videos/insulin pen teaching kit for RNs to start teaching patient and introducing basic survival skills concepts to patient to prepare for discharge. Also ordered a Dietitian consult for basic nutritional modification management. Currently, glucose control is within inpatient goal. Will follow patient while here.  Thanks,  Tama Headings RN, MSN, [facility] Inpatient Diabetes Coordinator Team Pager [phone] (8a-5p)

## 2015-04-19 NOTE — Progress Notes (Signed)
Patient ID: Lonnie Sullivan, male   DOB: Apr 15, 1966, 50 y.o.   MRN: [id number] EVENING ROUNDS NOTE :     [facility] [facility]       [place] [address]             [phone]                 2 Days Post-Op Procedure(s) (LRB): CORONARY ARTERY BYPASS GRAFTING (CABG)x 3 using left internal mammory artery and right saphenous leg vein harvested endoscopically  (N/A)  Total Length of Stay:  LOS: 1 day  BP 116/71 mmHg  Pulse 87  Temp(Src) 97.8 F (36.6 C) (Oral)  Resp 24  Ht 5\' 10"  (1.778 m)  Wt 318 lb 2 oz (144.3 kg)  BMI 45.65 kg/m2  SpO2 97%  .Intake/Output      12/04 0701 - 12/05 0700 12/05 0701 - 12/06 0700   P.O. 2100 740   I.V. (mL/kg) 2113.2 (14.6) 122.4 (0.8)   Blood     IV Piggyback 640    Total Intake(mL/kg) 4853.2 (33.6) 862.4 (6)   Urine (mL/kg/hr) 925 (0.3) 340 (0.2)   Chest Tube 450 (0.1) 110 (0.1)   Total Output 1375 450   Net +3478.2 +412.4          . dexmedetomidine Stopped (04/19/15 1400)     Lab Results  Component Value Date   WBC 22.7* 04/19/2015   HGB 10.8* 04/19/2015   HCT 32.0* 04/19/2015   PLT 173 04/19/2015   GLUCOSE 158* 04/19/2015   CHOL 99 04/19/2015   TRIG 88 04/19/2015   HDL 25* 04/19/2015   LDLCALC 56 04/19/2015   ALT 67* 04/17/2015   AST 247* 04/17/2015   NA 133* 04/19/2015   K 5.1 04/19/2015   CL 99* 04/19/2015   CREATININE 1.52* 04/19/2015   BUN 32* 04/19/2015   CO2 23 04/19/2015   INR 1.40 04/19/2015   IAB out Up in chair Neuro intact   Grace Isaac MD  Beeper [phone] Office [phone] 04/19/2015 6:47 PM

## 2015-04-20 ENCOUNTER — Inpatient Hospital Stay (HOSPITAL_COMMUNITY): Payer: Self-pay

## 2015-04-20 LAB — BASIC METABOLIC PANEL
Anion gap: 7 (ref 5–15)
BUN: 27 mg/dL — AB (ref 6–20)
CALCIUM: 8.2 mg/dL — AB (ref 8.9–10.3)
CO2: 25 mmol/L (ref 22–32)
CREATININE: 1.26 mg/dL — AB (ref 0.61–1.24)
Chloride: 100 mmol/L — ABNORMAL LOW (ref 101–111)
GFR calc Af Amer: >60 mL/min (ref >60)
GLUCOSE: 157 mg/dL — AB (ref 65–99)
POTASSIUM: 5.4 mmol/L — AB (ref 3.5–5.1)
SODIUM: 132 mmol/L — AB (ref 135–145)

## 2015-04-20 LAB — CBC
HCT: 31.4 % — ABNORMAL LOW (ref 39.0–52.0)
Hemoglobin: 10.3 g/dL — ABNORMAL LOW (ref 13.0–17.0)
MCH: 29 pg (ref 26.0–34.0)
MCHC: 32.8 g/dL (ref 30.0–36.0)
MCV: 88.5 fL (ref 78.0–100.0)
Platelets: 189 10*3/uL (ref 150–400)
RBC: 3.55 MIL/uL — ABNORMAL LOW (ref 4.22–5.81)
RDW: 13.2 % (ref 11.5–15.5)
WBC: 23.9 10*3/uL — AB (ref 4.0–10.5)

## 2015-04-20 LAB — GLUCOSE, CAPILLARY
GLUCOSE-CAPILLARY: 152 mg/dL — AB (ref 65–99)
Glucose-Capillary: 147 mg/dL — ABNORMAL HIGH (ref 65–99)
Glucose-Capillary: 122 mg/dL — ABNORMAL HIGH (ref 65–99)

## 2015-04-20 LAB — HEMOGLOBIN A1C
Hgb A1c MFr Bld: 6.6 % — ABNORMAL HIGH (ref 4.8–5.6)
Hgb A1c MFr Bld: 6.4 % — ABNORMAL HIGH (ref 4.8–5.6)
Mean Plasma Glucose: 143 mg/dL
Mean Plasma Glucose: 137 mg/dL

## 2015-04-20 MED ORDER — SODIUM CHLORIDE 0.9 % IJ SOLN
3.0000 mL | Freq: Two times a day (BID) | INTRAMUSCULAR | Status: DC
Start: 2015-04-20 — End: 2015-04-24
  Administered 2015-04-20 – 2015-04-23 (×4): 3 mL via INTRAVENOUS

## 2015-04-20 MED ORDER — FUROSEMIDE 10 MG/ML IJ SOLN
20.0000 mg | Freq: Two times a day (BID) | INTRAMUSCULAR | Status: AC
  Administered 2015-04-20 (×2): 20 mg via INTRAVENOUS
  Filled 2015-04-20 (×2): qty 2

## 2015-04-20 MED ORDER — FUROSEMIDE 40 MG PO TABS
40.0000 mg | ORAL_TABLET | Freq: Every day | ORAL | Status: AC
  Administered 2015-04-21 – 2015-04-22 (×2): 40 mg via ORAL
  Filled 2015-04-20 (×2): qty 1

## 2015-04-20 MED ORDER — SODIUM CHLORIDE 0.9 % IV SOLN: 250.0000 mL | INTRAVENOUS | Status: DC | PRN

## 2015-04-20 MED ORDER — SODIUM CHLORIDE 0.9 % IJ SOLN: 3.0000 mL | INTRAMUSCULAR | Status: DC | PRN

## 2015-04-20 MED ORDER — MOVING RIGHT ALONG BOOK
Freq: Once | Status: AC
  Administered 2015-04-20: 08:00:00
  Filled 2015-04-20: qty 1

## 2015-04-20 NOTE — Progress Notes (Addendum)
Nutrition Consult/Brief Note  RD consulted for nutrition education regarding diabetes. Pt declined education at this time; not feeling well.   RD provided "Carbohydrate Counting for People with Diabetes" handout from the Academy of [employer] and Dietetics; left on tray table.  RD to follow up at later date.  Arthur Holms, RD, LDN Pager #: [phone] After-Hours Pager #: [phone]

## 2015-04-20 NOTE — Progress Notes (Addendum)
[address]       [place] [address]             [phone]        CARDIOTHORACIC SURGERY PROGRESS NOTE   R3 Days Post-Op Procedure(s) (LRB): CORONARY ARTERY BYPASS GRAFTING (CABG)x 3 using left internal mammory artery and right saphenous leg vein harvested endoscopically  (N/A)  Subjective: Looks very good and feeling better.  Soreness in chest and shoulders improved.  Objective: Vital signs: BP Readings from Last 1 Encounters:  04/20/15 124/67   Pulse Readings from Last 1 Encounters:  04/20/15 79   Resp Readings from Last 1 Encounters:  04/20/15 14   Temp Readings from Last 1 Encounters:  04/20/15 97.6 F (36.4 C) Oral    Hemodynamics: PAP: (25)/(21) 25/21 mmHg  Physical Exam:  Rhythm:   sinus  Breath sounds: clear  Heart sounds:  RRR  Incisions:  Dressing dry, intact  Abdomen:  Soft, non-distended, non-tender  Extremities:  Warm, well-perfused    Intake/Output from previous day: 12/05 0701 - 12/06 0700 In: 2372.4 [P.O.:1940; I.V.:132.4; IV Piggyback:50] Out: 1500 [Urine:1390; Chest Tube:110] Intake/Output this shift:    Lab Results:  CBC: Recent Labs  04/19/15 1615 04/20/15 0400  WBC 22.7* 23.9*  HGB 10.8* 10.3*  HCT 32.0* 31.4*  PLT 173 189    BMET:  Recent Labs  04/19/15 0400 04/19/15 1610 04/19/15 1615 04/20/15 0400  NA 135 133*  --  132*  K 5.1 5.1  --  5.4*  CL 105 99*  --  100*  CO2 23  --   --  25  GLUCOSE 191* 158*  --  157*  BUN 24* 32*  --  27*  CREATININE 1.55* 1.40* 1.52* 1.26*  CALCIUM 8.2*  --   --  8.2*     PT/INR:   Recent Labs  04/19/15 0400  LABPROT 17.3*  INR 1.40    CBG (last 3)   Recent Labs  04/19/15 1137 04/19/15 1601 04/19/15 2136  GLUCAP 161* 147* 184*    ABG    Component Value Date/Time   PHART 7.356 04/19/2015 0410   PCO2ART 37.5 04/19/2015 0410   PO2ART 74.8* 04/19/2015 0410   HCO3 20.4 04/19/2015 0410   TCO2 23 04/19/2015 1610   ACIDBASEDEF 4.1*  04/19/2015 0410   O2SAT 94.2 04/19/2015 0410    CXR: PORTABLE CHEST 1 VIEW  COMPARISON: Portable chest x-ray of April 19, 2015  FINDINGS: The cardiopericardial silhouette remains enlarged. The pulmonary vascularity on the right appears normal and on the left remains slightly engorged centrally. There is no significant pleural effusion. There is perihilar subsegmental atelectasis on the left. There is no pneumothorax. The Swan-Ganz catheter has been removed. The mediastinal drain and left-sided chest tube have been will removed. The right internal jugular Cordis sheath remains.  IMPRESSION: Further interval improvement in the appearance of the pulmonary interstitium and pulmonary vascularity. A small amount of perihilar subsegmental atelectasis persist bilaterally. There is no pneumothorax or significant pleural effusion. Stable enlargement of the cardiac silhouette. There are 8 intact sternal wires.   Electronically Signed  By: David Martinique M.D.  On: 04/20/2015 07:32   Assessment/Plan: S/P Procedure(s) (LRB): CORONARY ARTERY BYPASS GRAFTING (CABG)x 3 using left internal mammory artery and right saphenous leg vein harvested endoscopically  (N/A)  Overall doing remarkably well POD3 Maintaining NSR w/ BP Breathing comfortably w/ O2 sats 94-96% on 3 L/min via [place] and CXR clear S/P acute inferior STEMI Expected  post op acute blood loss anemia, mild, stable Acute systolic CHF with expected post-op volume excess, stable/improved Acute kidney injury w/ creatinine down to 1.3 today, likely secondary to ATN and pre-renal azotemia due to acute MI and CABG Low-grade fevers and leukocytosis, likely reactive Morbid obesity Type II diabetes mellitus, newly-diagnosed, adequate glycemic control   Mobilize  Diuresis  Watch renal function  Watch WBC and fevers, no indications for ABx at this time  Lovenox for DVT prophylaxis  Continue levemir and SSI  Transfer step  down  Rexene Alberts, MD 04/20/2015 7:49 AM

## 2015-04-20 NOTE — Care Management Note (Signed)
Case Management Note  Patient Details  Name: Lonnie Sullivan MRN: [id number] Date of Birth: September 06, 1965  Subjective/Objective:                  Emergent CABG 12-3 post STEMI.   Balloon pump off 12-5 Per staff - going through divorce - has two older - (in 20's) kids living with him.  Neither drive.  From [place].   Patient up in chair. Talking on phone.   States lives at home alone, has a 51 yo son, Erlene Quan who is with him every other week, has an older daughter but is going to be out of town visiting with her fiance in [place], who is in the service.  States son could be with him if ex will allow but son may not be very dependable to assist him.  Discussed he will get better with time and hopefully will not need assistance getting up and to and from bathroom prior to going home, would just need someone to be with him 24/7 for 7-10 days to call if needed help.   States lots of pain now, cannot even reach for cup to drink, states really sore.  Will order PT - if eligible in house rehab may be an option prior to going home.  Will continue to follow. Was to see an attorney tomorrow in [place] - located office number and left message that patient is hospitalized and would not be able to make that appointment.  Patient will call and reschedule when able.    Action/Plan:   Expected Discharge Date:                  Expected Discharge Plan:  Home/Self Care  In-House Referral:     Discharge planning Services  CM Consult  Post Acute Care Choice:    Choice offered to:     DME Arranged:    DME Agency:     HH Arranged:    HH Agency:     Status of Service:  In process, will continue to follow  Medicare Important Message Given:    Date Medicare IM Given:    Medicare IM give by:    Date Additional Medicare IM Given:    Additional Medicare Important Message give by:     If discussed at [place] of Stay Meetings, dates discussed:    Additional Comments:  Vergie Living,  RN 04/20/2015, 1:26 PM

## 2015-04-20 NOTE — Progress Notes (Signed)
PT Cancellation Note  Patient Details Name: Lonnie Sullivan MRN: [id number] DOB: 1966-01-17   Cancelled Treatment:    Reason Eval/Treat Not Completed: Fatigue/lethargy limiting ability to participate;Pain limiting ability to participate; patient reports R UE pain and nausea and unable to participate in PT eval this pm.  Noted R UE with numbness and pain esp in bicipital area.  PROM performed and encouraged pt to squeeze rolled cloth.  He reported improvement.  RN aware pt in need of medication.  Will attempt full eval tomorrow.   Tayva Easterday,CYNDI 04/20/2015, 4:41 PM  Magda Kiel, Amanda 04/20/2015

## 2015-04-20 NOTE — Progress Notes (Signed)
[id number] Came to walk with pt who stated he has walked twice already and will walk again this evening with staff. Pt stated his right hand numb and hurting in upper arm. Put right arm on pillow and pt stated it felt better. Gave pt IS and encouraged use. Pt stated he was nauseated so RN notified. Gave pt ice for diet coke at his request. Emotional support given. Will follow up tomorrow. Graylon Good RN BSN 04/20/2015 2:14 PM

## 2015-04-21 ENCOUNTER — Inpatient Hospital Stay (HOSPITAL_COMMUNITY): Payer: Self-pay

## 2015-04-21 LAB — BASIC METABOLIC PANEL
ANION GAP: 8 (ref 5–15)
BUN: 18 mg/dL (ref 6–20)
CALCIUM: 8.2 mg/dL — AB (ref 8.9–10.3)
CO2: 27 mmol/L (ref 22–32)
Chloride: 97 mmol/L — ABNORMAL LOW (ref 101–111)
Creatinine, Ser: 0.85 mg/dL (ref 0.61–1.24)
GFR calc Af Amer: >60 mL/min (ref >60)
GFR calc non Af Amer: >60 mL/min (ref >60)
GLUCOSE: 106 mg/dL — AB (ref 65–99)
Potassium: 4.8 mmol/L (ref 3.5–5.1)
Sodium: 132 mmol/L — ABNORMAL LOW (ref 135–145)

## 2015-04-21 LAB — GLUCOSE, CAPILLARY
GLUCOSE-CAPILLARY: 117 mg/dL — AB (ref 65–99)
GLUCOSE-CAPILLARY: 122 mg/dL — AB (ref 65–99)
Glucose-Capillary: 111 mg/dL — ABNORMAL HIGH (ref 65–99)
Glucose-Capillary: 146 mg/dL — ABNORMAL HIGH (ref 65–99)

## 2015-04-21 LAB — CBC
HEMATOCRIT: 31 % — AB (ref 39.0–52.0)
Hemoglobin: 10.1 g/dL — ABNORMAL LOW (ref 13.0–17.0)
MCH: 28.8 pg (ref 26.0–34.0)
MCHC: 32.6 g/dL (ref 30.0–36.0)
MCV: 88.3 fL (ref 78.0–100.0)
Platelets: 222 10*3/uL (ref 150–400)
RBC: 3.51 MIL/uL — ABNORMAL LOW (ref 4.22–5.81)
RDW: 13.1 % (ref 11.5–15.5)
WBC: 17.4 10*3/uL — AB (ref 4.0–10.5)

## 2015-04-21 MED ORDER — LISINOPRIL 2.5 MG PO TABS
2.5000 mg | ORAL_TABLET | Freq: Every day | ORAL | Status: DC
  Administered 2015-04-22 – 2015-04-24 (×3): 2.5 mg via ORAL
  Filled 2015-04-21 (×3): qty 1

## 2015-04-21 MED FILL — Heparin Sodium (Porcine) 2 Unit/ML in Sodium Chloride 0.9%: INTRAMUSCULAR | Qty: 1000 | Status: AC

## 2015-04-21 MED FILL — Lidocaine HCl Local Preservative Free (PF) Inj 1%: INTRAMUSCULAR | Qty: 30 | Status: AC

## 2015-04-21 NOTE — Progress Notes (Signed)
CARDIAC REHAB PHASE I   PRE:  Rate/Rhythm: 83 SR    BP: sitting 106/72    SaO2: 95 2L  MODE:  Ambulation: 350 ft   POST:  Rate/Rhythm: 103 ST    BP: sitting 129/69     SaO2: 96 RA  Pt eager to walk. Stood independently and used RW, min assist. Rest x2 due to labored breathing. SaO2 97 RA walking, HR stable. Pt pale. To bed for wires to be pulled. Pt motivated today. Will f/u. Left off O2 with RN aware. [id number]   Lonnie Shih [place] CES, ACSM 04/21/2015 11:21 AM

## 2015-04-21 NOTE — Progress Notes (Signed)
Utilization review completed.  

## 2015-04-21 NOTE — Progress Notes (Addendum)
       [address]       [place] [address]             [phone]          4 Days Post-Op Procedure(s) (LRB): CORONARY ARTERY BYPASS GRAFTING (CABG)x 3 using left internal mammory artery and right saphenous leg vein harvested endoscopically  (N/A)  Subjective: Feels better today. Main complaint is soreness in shoulders and numbness in right hand. Nausea resolved and appetite better today.   Objective: Vital signs in last 24 hours: Patient Vitals for the past 24 hrs:  BP Temp Temp src Pulse Resp SpO2 Weight  04/21/15 0539 113/71 mmHg 97.7 F (36.5 C) Oral 74 20 98 % (!) 316 lb (143.337 kg)  04/20/15 2008 112/71 mmHg 97.9 F (36.6 C) Oral 81 20 96 % -  04/20/15 1907 111/68 mmHg 99.4 F (37.4 C) Oral 82 20 96 % -  04/20/15 1215 125/80 mmHg 99.5 F (37.5 C) Oral 79 17 94 % -  04/20/15 1000 103/69 mmHg - - 79 18 95 % -  04/20/15 0953 107/73 mmHg - - 77 - - -  04/20/15 0900 107/73 mmHg - - 76 15 94 % -   Current Weight  04/21/15 316 lb (143.337 kg)  BASELINE WEIGHT:118 kg   Intake/Output from previous day: 12/06 0701 - 12/07 0700 In: 600 [P.O.:600] Out: 2125 [Urine:2125]  CBGs [id number]   PHYSICAL EXAM:  Heart: RRR Lungs: Diminished BS in bases Wound: Clean and dry Extremities: Mild LE edema    Lab Results: CBC: Recent Labs  04/20/15 0400 04/21/15 0252  WBC 23.9* 17.4*  HGB 10.3* 10.1*  HCT 31.4* 31.0*  PLT 189 222   BMET:  Recent Labs  04/20/15 0400 04/21/15 0252  NA 132* 132*  K 5.4* 4.8  CL 100* 97*  CO2 25 27  GLUCOSE 157* 106*  BUN 27* 18  CREATININE 1.26* 0.85  CALCIUM 8.2* 8.2*    PT/INR:  Recent Labs  04/19/15 0400  LABPROT 17.3*  INR 1.40   CXR: FINDINGS: The lungs are mildly hypoinflated. Perihilar subsegmental atelectasis persists. The cardiac silhouette remains enlarged. The pulmonary vascularity is normal. There is are small pleural effusions layering posteriorly. There are 8 intact sternal  wires from previous CABG. The observed bony thorax is unremarkable.  IMPRESSION: Persistent bilateral perihilar subsegmental atelectasis. Small pleural effusions layering posteriorly. Stable cardiomegaly without pulmonary edema.    Assessment/Plan: S/P Procedure(s) (LRB): CORONARY ARTERY BYPASS GRAFTING (CABG)x 3 using left internal mammory artery and right saphenous leg vein harvested endoscopically  (N/A)  CV- SR, BPs generally stable. Continue beta blocker. May be able to add ACE-I soon since Cr trending down.  AKI- Cr back to baseline. Continue to monitor.  ID- WBC trending down, afebrile. Likely atelectasis. Continue IS/pulm toilet. Still on 3L O2. Wean O2 as able.  Newly diagnosed DM- A1C=6.6. CBGs stable on Levemir.  Will probably need to switch to oral agent for home.  Vol overload/Acute systolic CHF- Continue diuresis.  Expected post op blood loss anemia- H/H stable.  CRPI, ambulation.  Hopefully home in the next few days if he continues to progress.    LOS: 3 days    COLLINS,GINA H 04/21/2015  I have seen and examined the patient and agree with the assessment and plan as outlined.  Rexene Alberts, MD 04/21/2015 9:15 AM

## 2015-04-21 NOTE — Evaluation (Signed)
Physical Therapy Evaluation Patient Details Name: Lonnie Sullivan MRN: [id number] DOB: [birth year] Today's Date: 04/21/2015   History of Present Illness  pt is a 49 y/o male with h/o celiac ds and CAD admitted with progressively worsening CP.  EKG demonstrated inferior ST elevfation with posterolateral ST depression.  Code STEMI called.  Pt s/p CABG x3    Clinical Impression  Pt admitted with/for CP, STEMI, s/p CABG..  Pt currently limited functionally due to the problems listed below.  (see problems list.)  Pt will benefit from PT to maximize function and safety to be able to get home safely with available assist of family.     Follow Up Recommendations Home health PT;Other (comment) (supervision up to 24 hour assist initially)    Equipment Recommendations  None recommended by PT    Recommendations for Other Services       Precautions / Restrictions Precautions Precautions: Sternal      Mobility  Bed Mobility Overal bed mobility: Needs Assistance Bed Mobility: Supine to Sit     Supine to sit: Min assist     General bed mobility comments: Reinforced sternal precautions and worked on [employer] Overall transfer level: Needs assistance Equipment used: Conservation officer, nature (2 wheeled) Transfers: Sit to/from [employer] to Stand: Min assist         General transfer comment: used momentum to stand without using UE's  Ambulation/Gait Ambulation/Gait assistance: Supervision Ambulation Distance (Feet): 380 Feet (with 2 standing rest breaks) Assistive device: Rolling walker (2 wheeled) Gait Pattern/deviations: Step-through pattern Gait velocity: slower   General Gait Details: generally steady with minor use of the RW.  Sats on RA  99% and 92 bpm, but with notable dyspnea.  Return at sats 93% and EHR 96 bpm.  Stairs            Wheelchair Mobility    Modified Rankin (Stroke Patients Only)       Balance Overall balance assessment: Needs  assistance Sitting-balance support: No upper extremity supported Sitting balance-Leahy Scale: Good     Standing balance support: No upper extremity supported Standing balance-Leahy Scale: Fair                               Pertinent Vitals/Pain Pain Assessment: Faces Faces Pain Scale: Hurts even more Pain Location: R arm Pain Descriptors / Indicators: Aching Pain Intervention(s): Limited activity within patient's tolerance;Monitored during session    [place] expects to be discharged to:: Private residence Living Arrangements: Alone Available Help at Discharge: Available PRN/intermittently (little to no assist) Type of Home: House Home Access: Stairs to enter Entrance Stairs-Rails: Psychiatric nurse of Steps: several Home Layout: One level Home Equipment: None      Prior Function Level of Independence: Independent               Hand Dominance        Extremity/Trunk Assessment               Lower Extremity Assessment: Overall WFL for tasks assessed         Communication   Communication: No difficulties  Cognition Arousal/Alertness: Awake/alert Behavior During Therapy: WFL for tasks assessed/performed Overall Cognitive Status: Within Functional Limits for tasks assessed                      General Comments General comments (skin integrity, edema, etc.): Hot packs to R UE pain  at site of pain and at Cervical spine.  Suspect impingement on the surgical table causing nerve trauma..  Will revisit.    Exercises        Assessment/Plan    PT Assessment Patient needs continued PT services  PT Diagnosis Abnormality of gait (decreased activity tolerance)   PT Problem List Decreased activity tolerance;Decreased balance;Decreased mobility;Cardiopulmonary status limiting activity;Decreased knowledge of use of DME;Pain;Decreased knowledge of precautions  PT Treatment Interventions Gait training;Stair  training;Functional mobility training;Therapeutic activities;Balance training;Patient/family education;DME instruction   PT Goals (Current goals can be found in the Care Plan section) Acute Rehab PT Goals Patient Stated Goal: home independent PT Goal Formulation: With patient Time For Goal Achievement: 05/05/15 Potential to Achieve Goals: Good    Frequency Min 3X/week   Barriers to discharge Decreased caregiver support      Co-evaluation               End of Session   Activity Tolerance: Patient tolerated treatment well;Patient limited by fatigue Patient left: in chair;with call bell/phone within reach Nurse Communication: Mobility status         Time: [id number] PT Time Calculation (min) (ACUTE ONLY): 36 min   Charges:   PT Evaluation $Initial PT Evaluation Tier I: 1 Procedure PT Treatments $Gait Training: 8-22 mins   PT G Codes:        Carmen Tolliver, Tessie Fass 04/21/2015, 2:09 PM 04/21/2015  Donnella Sham, PT [phone]  (pager)

## 2015-04-21 NOTE — Progress Notes (Signed)
Pts EPW d/c'd per order and per protocol. Tips intact. Pt tolerated procedure well. Pt educated on need for vitals q15 min and bedrest for 1 hour. Call bell and phone within reach. Will continue to monitor.

## 2015-04-22 LAB — GLUCOSE, CAPILLARY
GLUCOSE-CAPILLARY: 104 mg/dL — AB (ref 65–99)
GLUCOSE-CAPILLARY: 98 mg/dL (ref 65–99)
Glucose-Capillary: 83 mg/dL (ref 65–99)
Glucose-Capillary: 113 mg/dL — ABNORMAL HIGH (ref 65–99)

## 2015-04-22 MED ORDER — FUROSEMIDE 40 MG PO TABS
40.0000 mg | ORAL_TABLET | Freq: Once | ORAL | Status: AC
  Administered 2015-04-22: 40 mg via ORAL
  Filled 2015-04-22: qty 1

## 2015-04-22 MED ORDER — METFORMIN HCL 500 MG PO TABS
500.0000 mg | ORAL_TABLET | Freq: Two times a day (BID) | ORAL | Status: DC
  Administered 2015-04-22 – 2015-04-24 (×4): 500 mg via ORAL
  Filled 2015-04-22 (×4): qty 1

## 2015-04-22 MED ORDER — LACTULOSE 10 GM/15ML PO SOLN
20.0000 g | Freq: Once | ORAL | Status: AC
  Administered 2015-04-22: 20 g via ORAL
  Filled 2015-04-22: qty 30

## 2015-04-22 NOTE — Evaluation (Signed)
Occupational Therapy Evaluation Patient Details Name: Lonnie Sullivan MRN: [id number] DOB: 09-30-1965 Today's Date: 04/22/2015    History of Present Illness pt is a 49 y/o male with h/o celiac ds and CAD admitted with progressively worsening CP.  EKG demonstrated inferior ST elevfation with posterolateral ST depression.  Code STEMI called.  Pt s/p CABG x3     Clinical Impression   Pt was independent prior to admission.  Presents with R UE pain with minimal functional use, impaired balance and decreased activity tolerance interfering with ability to perform ADL and ADL transfers. Will follow acutely.    Follow Up Recommendations  Home health OT;Supervision/Assistance - 24 hour (initially)    Equipment Recommendations  3 in 1 bedside comode    Recommendations for Other Services       Precautions / Restrictions Precautions Precautions: Sternal Precaution Comments: instructed in sternal precautions related to ADL Restrictions Weight Bearing Restrictions: Yes (sternal precautions)      Mobility Bed Mobility Overal bed mobility: Needs Assistance Bed Mobility: Supine to Sit;Sit to Supine     Supine to sit: Min assist Sit to supine: Min guard   General bed mobility comments: HOB up, use of momentum to avoid use of UEs to raise trunk  Transfers Overall transfer level: Needs assistance Equipment used: Rolling walker (2 wheeled) Transfers: Sit to/from Stand Sit to Stand: Min assist         General transfer comment: used momentum to stand without using UE's    Balance     Sitting balance-Leahy Scale: Good       Standing balance-Leahy Scale: Fair                              ADL Overall ADL's : Needs assistance/impaired Eating/Feeding: Set up;Sitting Eating/Feeding Details (indicate cue type and reason): self feeds with L hand with assist to set up tray Grooming: Wash/dry hands;Standing;Minimal assistance   Upper Body Bathing: Minimal  assitance;Sitting   Lower Body Bathing: Maximal assistance;Sit to/from stand   Upper Body Dressing : Minimal assistance;Sitting Upper Body Dressing Details (indicate cue type and reason): instructed in compensatory strategies Lower Body Dressing: Maximal assistance;Sit to/from stand   Toilet Transfer: Minimal assistance;Ambulation;RW;Regular Toilet   Toileting- Clothing Manipulation and Hygiene: Maximal assistance;Sit to/from stand       Functional mobility during ADLs: Minimal assistance;Rolling walker General ADL Comments: Began instruction in use of reacher, long handled bath sponge, compensatory strategies for impaired ability to use R hand     Vision     Perception     Praxis      Pertinent Vitals/Pain Pain Assessment: Faces Faces Pain Scale: Hurts even more Pain Location: R shoulder Pain Descriptors / Indicators: Aching Pain Intervention(s): Limited activity within patient's tolerance;Repositioned;Heat applied     Hand Dominance Right   Extremity/Trunk Assessment Upper Extremity Assessment Upper Extremity Assessment: RUE deficits/detail RUE Deficits / Details: FF to 60 degrees, full AROM elbow to hand, did not formally assess secondary to pain RUE: Unable to fully assess due to pain RUE Sensation: decreased light touch (numbness from wrist distal) RUE Coordination: decreased fine motor;decreased gross motor (no functional use)   Lower Extremity Assessment Lower Extremity Assessment: Defer to PT evaluation       Communication Communication Communication: No difficulties   Cognition Arousal/Alertness: Awake/alert Behavior During Therapy: WFL for tasks assessed/performed Overall Cognitive Status: Within Functional Limits for tasks assessed  General Comments       Exercises       Shoulder Instructions      Home Living Family/patient expects to be discharged to:: Private residence Living Arrangements: Alone Available Help  at Discharge: Available PRN/intermittently Type of Home: House Home Access: Stairs to enter [employer] of Steps: 2 Entrance Stairs-Rails: Right;Left Home Layout: One level     Bathroom Shower/Tub: Walk-in shower;Tub/shower unit   Bathroom Toilet: Standard     Home Equipment: None          Prior Functioning/Environment Level of Independence: Independent             OT Diagnosis: Generalized weakness;Acute pain   OT Problem List: Decreased strength;Decreased activity tolerance;Impaired balance (sitting and/or standing);Decreased coordination;Decreased knowledge of use of DME or AE;Decreased knowledge of precautions;Obesity;Impaired UE functional use;Pain   OT Treatment/Interventions: Self-care/ADL training;Energy conservation;DME and/or AE instruction;Therapeutic activities;Patient/family education;Balance training    OT Goals(Current goals can be found in the care plan section) Acute Rehab OT Goals Patient Stated Goal: home independent OT Goal Formulation: With patient Time For Goal Achievement: 05/06/15 Potential to Achieve Goals: Good ADL Goals Pt Will Perform Grooming: with supervision;standing Pt Will Perform Upper Body Bathing: with modified independence;sitting Pt Will Perform Lower Body Bathing: with supervision;sit to/from stand;with adaptive equipment Pt Will Perform Upper Body Dressing: sitting;with modified independence Pt Will Perform Lower Body Dressing: with supervision;with adaptive equipment;sit to/from stand Pt Will Transfer to Toilet: with supervision;ambulating;regular height toilet Pt Will Perform Toileting - Clothing Manipulation and hygiene: with modified independence;with adaptive equipment;sit to/from stand Pt Will Perform Tub/Shower Transfer: Shower transfer;with supervision;ambulating;3 in 1;rolling walker Additional ADL Goal #1: Pt will employ energy conservation strategies in ADL and mobility independently.  OT Frequency: Min  3X/week   Barriers to D/C:            Co-evaluation              End of Session Equipment Utilized During Treatment: Rolling walker;Gait belt Nurse Communication: Patient requests pain meds  Activity Tolerance: Patient limited by pain Patient left: in bed;with call bell/phone within reach;with nursing/sitter in room   Time: 1420-1438 OT Time Calculation (min): 18 min Charges:  OT General Charges $OT Visit: 1 Procedure OT Evaluation $Initial OT Evaluation Tier I: 1 Procedure G-Codes:    Malka So 04/22/2015, 3:17 PM  [phone]

## 2015-04-22 NOTE — Progress Notes (Signed)
CARDIAC REHAB PHASE I   PRE:  Rate/Rhythm:80 SR  BP:  Sitting: 121/62        SaO2: 98 2L, 96 RA  MODE:  Ambulation: 420 ft   POST:  Rate/Rhythm: 89 SR  BP:  Sitting: 141/79         SaO2: 98 RA  Pt ambulated 420 ft on RA, rolling walker, steady gait, tolerated well. Pt c/o 10/10 R arm pain, just received pain medicine prior to ambulation, RN aware. Pt c/o DOE associated with pain, denies dizziness, brief standing rest x1. Pt sats 98% on RA during ambulation. Pt to recliner after walk, feet elevated, call bell within reach, on room air. Pt appreciative of walk. Will follow.   [id number] Lenna Sciara, RN, BSN 04/22/2015 11:43 AM

## 2015-04-22 NOTE — Progress Notes (Signed)
PT Cancellation Note  Patient Details Name: Lonnie Sullivan MRN: [id number] DOB: 07-17-1965   Cancelled Treatment:    Reason Eval/Treat Not Completed: Patient declined, no reason specified.  Just finished working with OT and his R arm is too painful right now.  Will see pt as able 12/9. 04/22/2015  Donnella Sham, PT [phone]  (pager)   Kell Ferris, Tessie Fass 04/22/2015, 3:45 PM

## 2015-04-22 NOTE — Progress Notes (Addendum)
      [address]       [place] [address]             [phone]        51 Days Post-Op Procedure(s) (LRB): CORONARY ARTERY BYPASS GRAFTING (CABG)x 3 using left internal mammory artery and right saphenous leg vein harvested endoscopically  (N/A)  Subjective: Patient without bowel movement.  Objective: Vital signs in last 24 hours: Temp:  [97.5 F (36.4 C)-97.9 F (36.6 C)] 97.9 F (36.6 C) (12/08 0558) Pulse Rate:  [76-82] 76 (12/08 0558) Cardiac Rhythm:  [-] Normal sinus rhythm (12/07 2022) Resp:  [18-20] 18 (12/08 0558) BP: (102-118)/(48-80) 118/80 mmHg (12/08 0558) SpO2:  [97 %-98 %] 98 % (12/08 0558) Weight:  [315 lb 0.6 oz (142.9 kg)] 315 lb 0.6 oz (142.9 kg) (12/08 0558)  Pre op weight 118 kg Current Weight  04/22/15 315 lb 0.6 oz (142.9 kg)      Intake/Output from previous day: 12/07 0701 - 12/08 0700 In: -  Out: [place] [Urine:1625]   Physical Exam:  Cardiovascular: RRR Pulmonary: Slightly diminished at bases; no rales, wheezes, or rhonchi. Abdomen: Soft, non tender, bowel sounds present. Extremities:++ bilateral lower extremity edema. Wounds: Clean and dry.  No erythema or signs of infection.  Lab Results: CBC: Recent Labs  04/20/15 0400 04/21/15 0252  WBC 23.9* 17.4*  HGB 10.3* 10.1*  HCT 31.4* 31.0*  PLT 189 222   BMET:  Recent Labs  04/20/15 0400 04/21/15 0252  NA 132* 132*  K 5.4* 4.8  CL 100* 97*  CO2 25 27  GLUCOSE 157* 106*  BUN 27* 18  CREATININE 1.26* 0.85  CALCIUM 8.2* 8.2*    PT/INR:  Lab Results  Component Value Date   INR 1.40 04/19/2015   INR 1.45 04/18/2015   INR 4.88* 04/17/2015   ABG:  INR: Will add last result for INR, ABG once components are confirmed Will add last 4 CBG results once components are confirmed  Assessment/Plan:  1. CV - SR in the 70-80's. On Lopressor 12.5 mg bid and Lisinopril 2.5 mg daily. 2.  Pulmonary - On 2 liters via [place]. Wean to room air as tolerates. Encourage  incentive spirometer 3. Volume Overload - On Lasix 40 mg daily. Will give two times today to help better diurese 4.  Acute blood loss anemia - H and H yesterday stable at 10.1 and 31. 5. CBGs 146/117/122. Pre op HGA1C 6.4. Will start Metformin and patient will need close medical follow up after discharge. 6. LOC constipation 7. Patient is divorced and lives alone. He has two sons but they go to school. He may need short term SNF. Will ask social work to evaluate.  ZIMMERMAN,DONIELLE MPA-C 04/22/2015,7:44 AM  I have seen and examined the patient and agree with the assessment and plan as outlined.  Patient wants to go home when it is time for discharge.  He has a neighbor who can stay with him during the day and 2 teenage sons who will be with him when they are not in school, one of whom is 51 years old.  He has persistent pain in right shoulder and numbness in right hand.  Presumably related to sternotomy but right radial pulse is not palpable.  Ulnar pulse intact and adequate capillary refill.  Given that he had cath via right radial artery will get duplex and consider CTA.  Rexene Alberts, MD 04/22/2015 10:30 AM

## 2015-04-22 NOTE — Discharge Summary (Signed)
Physician Discharge Summary       [address]       [place] [address]             [phone]    Patient ID: Lonnie Sullivan MRN: [id number] DOB/AGE: [birth year] 49 y.o.  Admit date: 04/17/2015 Discharge date: 04/22/2015  Admission Diagnoses: 1. S/p STEMI 2. 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 Newly diagnosed DM 2. Morbid obesity ([place]) 3. History of celiac disease 4. History of anxiety 5. History of tobacco abuse 6. Mild ABL anemia  Procedure (s):  Cardiac catheterization done by Dr. Angelena Form on 04/17/2015:   Ost RCA to Prox RCA lesion, 100% stenosed.  LM lesion, 99% stenosed.  1st Mrg lesion, 30% stenosed.  Ost LAD lesion, 90% stenosed.  1st Diag lesion, 40% stenosed.  Dist LAD lesion, 30% stenosed.  1. Acute inferior STEMI secondary to occluded proximal RCA. 2. Severe stenosis distal Left main artery.  3. PTCA with balloon angioplasty of the RCA 4. Thrombectomy of the RCA 5. Placement IABP  Recommendations: Pt taken to the OR for emergent CABG with Dr. Roxy Manns.    Emergency Coronary Artery Bypass Grafting x 3 Left Internal Mammary Artery to Distal Left Anterior Descending Coronary Artery Saphenous Vein Graft to Posterior Descending Coronary Artery Saphenous Vein Graft to Obtuse Marginal Branch of Left Circumflex Coronary Artery Endoscopic Vein Harvest from Right Thigh by Dr. Roxy Manns on 04/18/2015.  History of Presenting Illness: This is a 49 year old male with no previous history of coronary artery disease. Risk factors are only notable for a strong family history of premature coronary artery disease. The patient's lipid status is unknown. He describes 4-6 month history of progressive symptoms of exertional substernal chest discomfort and shortness of breath. Several weeks ago he had a single episode of severe substernal chest pain associated with shortness  of breath that lasted approximately 2-3 hours. He did not seek medical attention at that time. Yesterday evening the patient developed substernal chest pain associated with shortness of breath. Symptoms waxed and waned overnight and eased up earlier this morning. Proximally 2 PM this afternoon the patient developed recurrent severe substernal chest pain. He presented to the emergency department where baseline EKG revealed acute inferior ST segment elevation with posterior lateral ST segment depression. Code STEMI was called and the patient was brought directly to the Cath Lab by Dr. Angelena Form. Catheterization demonstrates critical distal left main coronary artery disease with acute thrombotic occlusion of the right coronary artery. An attempt at primary balloon angioplasty of the right coronary artery was performed although only minimal antegrade flow could be reestablished. Emergency [facility] thoracic surgical consultation was requested. Intra-aortic balloon pump was placed in the Cath Lab.  On Dr. Guy Sandifer arrival in the Cath Lab, the  patient was awake and alert complaining of 5 over 10 substernal chest pain. He remained in sinus rhythm with stable blood pressure. He reports no other significant problems recently other than symptoms of exertional chest discomfort and shortness of breath that predated his acute onset of severe chest discomfort over the past 24 hours. He denies any history of PND, orthopnea, or lower extremity edema. He has not had palpitations, dizzy spells, or syncope. Remainder of his review of systems is noncontributory. Dr. Roxy Manns discussed the need for emergent coronary artery bypass surgery. Potential risks, benefits, and complications of the surgery were discussed with the patient and he agreed to proceed with surgery. He underwent emregent CABG x 3 on  04/18/2015.  Brief Hospital Course:  The patient was extubated the morning of post operative day one without difficulty. He remained  afebrile and hemodynamically stable. He was gradually weaned off of Milrinone, dopamine, and Neo synephrine drips. IABP remained until 12/5 and then was removed. Gordy Councilman, a line, chest tubes, and foley were removed early in the post operative course. Lopressor was started and titrated accordingly. He was volume over loaded and diuresed. He had complaints of right shoulder pain post op. He also had right numbness in his hand (had radial artery catheterization). Duplex of right upper extremity showed radial clot. CTA done showed no proximal arterial pathology.He had ABL anemia. He did not require a post op transfusion. His last H and H was stable at 10.1 and 31. He was weaned off the insulin drip. The patient's glucose remained well controlled. The patient's HGA1C pre op was 6.4. He was started on Metformin 500 mg bid. He will need close follow up with his medical doctor after discharge.  The patient was felt surgically stable for transfer from the ICU to [facility] for further convalescence on 04/21/2015. He continues to progress with cardiac rehab. He was requiring 2 liters of oxygen via [place]. He was later weaned to room air. He has been tolerating a diet and has had a bowel movement. Epicardial pacing wires and chest tube sutures will be removed prior to discharge. The patient is felt surgically stable for discharge today.   Latest Vital Signs: Blood pressure 118/80, pulse 76, temperature 97.9 F (36.6 C), temperature source Oral, resp. rate 18, height 5\' 10"  (1.778 m), weight 315 lb 0.6 oz (142.9 kg), SpO2 98 %.  Physical Exam: Cardiovascular: RRR Pulmonary: Slightly diminished at bases; no rales, wheezes, or rhonchi. Abdomen: Soft, non tender, bowel sounds present. Extremities:++ bilateral lower extremity edema. Wounds: Clean and dry. No erythema or signs of infection.  Discharge Condition:Stable and discharged to home  Recent laboratory studies:  Lab Results  Component Value Date   WBC 17.4*  04/21/2015   HGB 10.1* 04/21/2015   HCT 31.0* 04/21/2015   MCV 88.3 04/21/2015   PLT 222 04/21/2015   Lab Results  Component Value Date   NA 132* 04/21/2015   K 4.8 04/21/2015   CL 97* 04/21/2015   CO2 27 04/21/2015   CREATININE 0.85 04/21/2015   GLUCOSE 106* 04/21/2015    Diagnostic Studies: Dg Chest 2 View  04/21/2015  CLINICAL DATA:  Follow-up atelectasis. EXAM: CHEST  2 VIEW COMPARISON:  Portable chest x-ray of April 20, 2015 FINDINGS: The lungs are mildly hypoinflated. Perihilar subsegmental atelectasis persists. The cardiac silhouette remains enlarged. The pulmonary vascularity is normal. There is are small pleural effusions layering posteriorly. There are 8 intact sternal wires from previous CABG. The observed bony thorax is unremarkable. IMPRESSION: Persistent bilateral perihilar subsegmental atelectasis. Small pleural effusions layering posteriorly. Stable cardiomegaly without pulmonary edema. Electronically Signed   By: David  Martinique M.D.   On: 04/21/2015 07:33    Medication List    STOP taking these medications        metoCLOPramide 10 MG tablet  Commonly known as:  REGLAN      TAKE these medications        aspirin 325 MG EC tablet  Take 1 tablet (325 mg total) by mouth daily.     atorvastatin 40 MG tablet  Commonly known as:  LIPITOR  Take 1 tablet (40 mg total) by mouth daily at 6 PM.     furosemide 40  MG tablet  Commonly known as:  LASIX  Take 1 tablet (40 mg total) by mouth daily with breakfast.     lisinopril 2.5 MG tablet  Commonly known as:  PRINIVIL,ZESTRIL  Take 1 tablet (2.5 mg total) by mouth daily.     metFORMIN 500 MG tablet  Commonly known as:  GLUCOPHAGE  Take 1 tablet (500 mg total) by mouth 2 (two) times daily with a meal.     metoprolol tartrate 25 MG tablet  Commonly known as:  LOPRESSOR  Take 0.5 tablets (12.5 mg total) by mouth 2 (two) times daily.     oxyCODONE 5 MG immediate release tablet  Commonly known as:  Oxy IR/ROXICODONE    Take 1-2 tablets (5-10 mg total) by mouth every 4 (four) hours as needed for severe pain.       The patient has been discharged on:   1.Beta Blocker:  Yes [ x  ]                              No   [   ]                              If No, reason:  2.Ace Inhibitor/ARB: Yes [x   ]                                     No  [    ]                                     If No, reason:  3.Statin:   Yes [x   ]                  No  [   ]                  If No, reason:  4.Ecasa:  Yes  [ x  ]                  No   [   ]                  If No, reason:   Follow Up Appointments: Follow-up Information    Follow up with Rosaria Ferries, PA-C On 05/06/2015.   Specialties:  Cardiology, Radiology   Why:  Appointment time is at 9:00 am   Contact information:   [address] [place] [address] [phone]       Follow up with Rexene Alberts, MD On 05/31/2015.   Specialty:  Cardiothoracic Surgery   Why:  PA/LAT CXR to be taken (at [facility] which is in the same building as Dr. Catalina Gravel 05/30/2014 at 3:15 pm;Appointment time is at 4:00 pm   Contact information:   [place] [address] [phone]       Follow up with Leonard Downing, MD.   Specialty:  Family Medicine   Why:  Call for follow up appointment regarding further surveillance of HGA1C 6.4 and diabetes management   Contact information:   [place] [address] [phone]       Signed: Lars Pinks  MPA-C 04/22/2015, 10:57 AM

## 2015-04-23 ENCOUNTER — Encounter (HOSPITAL_COMMUNITY): Payer: Self-pay | Admitting: Thoracic Surgery (Cardiothoracic Vascular Surgery)

## 2015-04-23 ENCOUNTER — Inpatient Hospital Stay (HOSPITAL_COMMUNITY): Payer: Medicaid Other

## 2015-04-23 DIAGNOSIS — M79641 Pain in right hand: Secondary | ICD-10-CM

## 2015-04-23 DIAGNOSIS — I742 Embolism and thrombosis of arteries of the upper extremities: Secondary | ICD-10-CM | POA: Diagnosis not present

## 2015-04-23 HISTORY — DX: Embolism and thrombosis of arteries of the upper extremities: I74.2

## 2015-04-23 LAB — GLUCOSE, CAPILLARY
GLUCOSE-CAPILLARY: 112 mg/dL — AB (ref 65–99)
GLUCOSE-CAPILLARY: 97 mg/dL (ref 65–99)
GLUCOSE-CAPILLARY: 165 mg/dL — AB (ref 65–99)
Glucose-Capillary: 132 mg/dL — ABNORMAL HIGH (ref 65–99)

## 2015-04-23 MED ORDER — IOHEXOL 350 MG/ML SOLN
100.0000 mL | Freq: Once | INTRAVENOUS | Status: AC | PRN
  Administered 2015-04-23: 100 mL via INTRAVENOUS

## 2015-04-23 MED ORDER — FUROSEMIDE 40 MG PO TABS
40.0000 mg | ORAL_TABLET | Freq: Every day | ORAL | Status: DC
  Administered 2015-04-24: 40 mg via ORAL
  Filled 2015-04-23 (×2): qty 1

## 2015-04-23 NOTE — Progress Notes (Signed)
*  PRELIMINARY RESULTS* Vascular Ultrasound Upper Extremity Arterial Duplex has been completed. The right radial artery appears to be occluded with thrombus. There is triphasic flow in the right palmar arch.  Preliminary results discussed with Barnett Applebaum, PA from [facility].  04/23/2015 8:56 AM Maudry Mayhew, RVT, RDCS, RDMS

## 2015-04-23 NOTE — Progress Notes (Signed)
Physical Therapy Treatment Patient Details Name: Lonnie Sullivan MRN: [id number] DOB: January 25, 1966 Today's Date: 04/23/2015    History of Present Illness pt is a 49 y/o male with h/o celiac ds and CAD admitted with progressively worsening CP.  EKG demonstrated inferior ST elevfation with posterolateral ST depression.  Code STEMI called.  Pt s/p CABG x3. 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    PT Comments    Patient progressing well towards PT goals. Improved ambulation distance today but limited due to pain in right shoulder most likely secondary to new clot. Reviewed sternal precautions. Encouraged OOB to chair as much as tolerated. Will plan for stair training next session as tolerated. Will follow acutely.   Follow Up Recommendations  Home health PT;Other (comment) (supervision up to 24 hour assist initially)     Equipment Recommendations  None recommended by PT    Recommendations for Other Services       Precautions / Restrictions Precautions Precautions: Sternal Precaution Comments: Reviewed sternal precautions Restrictions Weight Bearing Restrictions: No    Mobility  Bed Mobility Overal bed mobility: Needs Assistance Bed Mobility: Supine to Sit     Supine to sit: Min assist;HOB elevated Sit to supine: Min guard   General bed mobility comments: HOB up, use of momentum to avoid use of UEs to raise trunk  Transfers Overall transfer level: Needs assistance Equipment used: Rolling walker (2 wheeled) Transfers: Sit to/from Stand Sit to Stand: Min guard         General transfer comment: Min guard for safety. Use of momentum to stand without using UEs.  Ambulation/Gait Ambulation/Gait assistance: Supervision Ambulation Distance (Feet): 400 Feet Assistive device: Rolling walker (2 wheeled) Gait Pattern/deviations: Step-through pattern;Decreased stride length Gait velocity: decreased Gait velocity interpretation: <1.8 ft/sec, indicative of risk for recurrent  falls General Gait Details: Steady with use of RW for support. Sp02 stayed 98% on RA but with notable dyspnea 3/4. 1 longer standing rest break.    Stairs            Wheelchair Mobility    Modified Rankin (Stroke Patients Only)       Balance Overall balance assessment: Needs assistance Sitting-balance support: Feet supported;No upper extremity supported Sitting balance-Leahy Scale: Good     Standing balance support: During functional activity Standing balance-Leahy Scale: Fair Standing balance comment: Able to walk short distances in room without UE support.                    Cognition Arousal/Alertness: Awake/alert Behavior During Therapy: WFL for tasks assessed/performed Overall Cognitive Status: Within Functional Limits for tasks assessed                      Exercises      General Comments        Pertinent Vitals/Pain Pain Assessment: 0-10 Pain Score: 10-Worst pain ever Pain Location: bil shoulders Pain Descriptors / Indicators: Sore;Aching Pain Intervention(s): Monitored during session;Repositioned;Patient requesting pain meds-RN notified;Heat applied    Home Living                      Prior Function            PT Goals (current goals can now be found in the care plan section) Progress towards PT goals: Progressing toward goals    Frequency  Min 3X/week    PT Plan Current plan remains appropriate    Co-evaluation  End of Session Equipment Utilized During Treatment: Gait belt Activity Tolerance: Patient tolerated treatment well;Patient limited by pain Patient left: in bed;with call bell/phone within reach     Time: 1322-1351 PT Time Calculation (min) (ACUTE ONLY): 29 min  Charges:  $Gait Training: 23-37 mins                    G Codes:      [name] A [name] 04/23/2015, 1:58 PM [name], [name], DPT [phone]

## 2015-04-23 NOTE — Consult Note (Signed)
Consult Note  Patient name: Lonnie Sullivan MRN: [id number] DOB: Dec 02, 1965 Sex: male  Consulting Physician:  Dr. Roxy Manns  Reason for Consult:  Chief Complaint  Patient presents with  . Chest Pain    HISTORY OF PRESENT ILLNESS: This is a 49 year old gentleman who presented to the hospital on 04/17/2015 with chest pain.  He presented to the emergency department with inferior ST elevation.  A code STEMI was called.  He was taken to the Cath Lab and underwent a heart catheterization via a right radial artery approach.  He was then taken to the operating room for three-vessel CABG.  Since his operation he is complaining of right shoulder pain as well as numbness in his right hand.  He had similar symptoms on the left but these resolved.  He recently underwent a duplex of his right arm which showed an occluded right radial artery with thrombus.  He did have a patent ulnar artery with triphasic waveforms in the palmar arch.  He states that his symptoms are slightly better on the right and significantly better on the left.  Past Medical History  Diagnosis Date  . Celiac disease   . Anxiety   . Left main coronary artery disease 04/17/2015  . Acute ST elevation myocardial infarction (STEMI) involving right coronary artery ([place]) 04/17/2015  . Coronary artery disease 04/17/2015    99% left main stenosis and 100% RCA occlusion  . Morbid obesity ([place]) 04/17/2015  . 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 Radial artery thrombosis, right ([place]) 04/23/2015    S/P diagnostic cardiac catheterization via right radial artery approach    Past Surgical History  Procedure Laterality Date  . None    . Cardiac catheterization N/A 04/17/2015    Procedure: Left Heart Cath and Coronary Angiography;  Surgeon: Burnell Blanks, MD;  Location: [facility];  Service: Cardiovascular;  Laterality: N/A;  . Cardiac catheterization N/A 04/17/2015    Procedure:  Coronary Stent Intervention;  Surgeon: Burnell Blanks, MD;  Location: [facility];  Service: Cardiovascular;  Laterality: N/A;  . Cardiac catheterization N/A 04/17/2015    Procedure: IABP Insertion;  Surgeon: Burnell Blanks, MD;  Location: [facility];  Service: Cardiovascular;  Laterality: N/A;  . Coronary artery bypass graft N/A 04/17/2015    Procedure: CORONARY ARTERY BYPASS GRAFTING (CABG)x 3 using left internal mammory artery and right saphenous leg vein harvested endoscopically ;  Surgeon: Rexene Alberts, MD;  Location: [facility];  Service: Open Heart Surgery;  Laterality: N/A;    Social History   Social History  . Marital Status: Married    Spouse Name: N/A  . Number of Children: N/A  . Years of Education: N/A   Occupational History  . Not on file.   Social History Main Topics  . Smoking status: Light Tobacco Smoker  . Smokeless tobacco: Not on file  . Alcohol Use: Yes     Comment: socially  . Drug Use: No  . Sexual Activity: Not on file   Other Topics Concern  . Not on file   Social History Narrative    Family History  Problem Relation Age of Onset  . CAD Father 67  . CAD Paternal Uncle 54    Allergies as of 04/17/2015  . (No Known Allergies)    No current facility-administered medications on file  prior to encounter.   Current Outpatient Prescriptions on File Prior to Encounter  Medication Sig Dispense Refill  . metoCLOPramide (REGLAN) 10 MG tablet Take 1 tablet (10 mg total) by mouth every 6 (six) hours as needed (nausea). (Patient not taking: Reported on 04/19/2015) 30 tablet 0     REVIEW OF SYSTEMS: Cardiovascular: Postop CABG, appropriate discomfort.  See above for arm findings Pulmonary: No productive cough, asthma or wheezing. Neurologic: No weakness, paresthesias, aphasia, or amaurosis. No dizziness. Hematologic: No bleeding problems or clotting disorders. Musculoskeletal: No joint pain or joint swelling. Gastrointestinal:  No blood in stool or hematemesis Genitourinary: No dysuria or hematuria. Psychiatric:: No history of major depression. Integumentary: No rashes or ulcers. Constitutional: No fever or chills.  PHYSICAL EXAMINATION: General: The patient appears their stated age.  Vital signs are BP 123/64 mmHg  Pulse 78  Temp(Src) 98.5 F (36.9 C) (Oral)  Resp 18  Ht 5\' 10"  (1.778 m)  Wt 313 lb 7.9 oz (142.2 kg)  BMI 44.98 kg/m2  SpO2 91% Pulmonary: Respirations are non-labored HEENT:  No gross abnormalities Abdomen: Soft and non-tender  Musculoskeletal: There are no major deformities.   Neurologic: Tingling in her right fingers.  Adequate grip strength.  Some difficulty with abduction of his right arm, Skin: There are no ulcer or rashes noted. Psychiatric: The patient has normal affect. Cardiovascular: There is a regular rate and rhythm without significant murmur appreciated.  Palpable right ulnar artery pulse.  Nonpalpable radial pulse on the right  Diagnostic Studies: I have reviewed his duplex with the following findings:. The right radial artery appears to be occluded with thrombus. There is triphasic flow in the right palmar arch.    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  I have ordered a CT angiogram of the chest and right arm to make sure that there is not additional pathology.  Regarding the clot in his radial artery, I would recommend repeat duplex in several days, just prior to discharge if we do not operate on him to make sure that there has been no propagation of the clot.         Eldridge Abrahams, M.D. [facility] Office: [phone] Pager:  [phone]

## 2015-04-23 NOTE — Progress Notes (Addendum)
       [address]       [place] [address]             [phone]          6 Days Post-Op Procedure(s) (LRB): CORONARY ARTERY BYPASS GRAFTING (CABG)x 3 using left internal mammory artery and right saphenous leg vein harvested endoscopically  (N/A)  Subjective: Had a rough night last night. Main complaint continues to be right shoulder pain and numbness in hand. Ultrasound not done. Walking in halls without difficulty, appetite good, +BM.   Objective: Vital signs in last 24 hours: Patient Vitals for the past 24 hrs:  BP Temp Temp src Pulse Resp SpO2 Weight  04/23/15 0613 123/64 mmHg 98.5 F (36.9 C) Oral 78 18 91 % -  04/23/15 0607 - - - - - - (!) 313 lb 7.9 oz (142.2 kg)  04/22/15 2042 123/70 mmHg 98.2 F (36.8 C) Oral 85 18 97 % -  04/22/15 1443 120/68 mmHg 98 F (36.7 C) Oral 80 18 94 % -   Current Weight  04/23/15 313 lb 7.9 oz (142.2 kg)  BASELINE WEIGHT:118 kg   Intake/Output from previous day: 12/08 0701 - 12/09 0700 In: 720 [P.O.:720] Out: 1675 [Urine:1675]  CBGs 83-113-112   PHYSICAL EXAM:  Heart: RRR Lungs: Clear Wound: Clean and dry Extremities: +LE edema, R arm/hand with normal motor function (limited by pain), hand warm with faintly palpable radial pulse    Lab Results: CBC: Recent Labs  04/21/15 0252  WBC 17.4*  HGB 10.1*  HCT 31.0*  PLT 222   BMET:  Recent Labs  04/21/15 0252  NA 132*  K 4.8  CL 97*  CO2 27  GLUCOSE 106*  BUN 18  CREATININE 0.85  CALCIUM 8.2*    PT/INR: No results for input(s): LABPROT, INR in the last 72 hours.    Assessment/Plan: S/P Procedure(s) (LRB): CORONARY ARTERY BYPASS GRAFTING (CABG)x 3 using left internal mammory artery and right saphenous leg vein harvested endoscopically  (N/A)  CV- SR, BPs generally stable. Continue Lopressor, Lisinopril.  AKI- Cr back to baseline. Continue to monitor.  ID- WBC trending down, afebrile. Follow up labs in am.  Newly diagnosed DM-  A1C=6.6. CBGs stable on metformin, SSI.  Vol overload/Acute systolic CHF- Continue diuresis- will repeat Lasix bid today.  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 Duplex ordered yesterday but not done- will make sure this gets done this am since he had a radial cath and has a diminished radial pulse.   CRPI, ambulation.  Hopefully home this weekend. His brother will be coming to town to assist him at home.   LOS: 5 days    COLLINS,GINA H 04/23/2015  ADDENDUM: Spoke with vascular lab tech- Duplex shows right radial artery thrombus/occlusion. Discussed with Dr. Roxy Manns. Will consult [facility] to evaluate.   I have seen and examined the patient and agree with the assessment and plan as outlined.  Entire right arm and hand remains quite painful.  Duplex confirms clot in radial artery.  Palmar arch intact and cap refill adequate.  Will get CTA to evaluate proximal extent of clot.  Discussed with Dr. Trula Slade from Vascular Surgery.  Keep NPO for now until a decision is made whether or not surgical intervention will be necessary.  Okay to start IV heparin if indicated.  Rexene Alberts, MD 04/23/2015 9:56 AM

## 2015-04-23 NOTE — Progress Notes (Signed)
Nutrition Follow Up/Brief Note  RD followed up for DM diet education.  Pt essentially declined and stated "I don't have diabetes".  He reports he does not need any diet modifications as he already eats "healthy".  Body mass index is 44.98 kg/(m^2). Patient meets criteria for Obesity Class III based on current BMI.   Current diet order is Heart Healthy/Carbohydrate Modified, patient is consuming approximately 100% of meals at this time. Labs and medications reviewed.   No nutrition interventions warranted at this time. If nutrition issues arise, please consult RD.   Arthur Holms, RD, LDN Pager #: [phone] After-Hours Pager #: [phone]

## 2015-04-23 NOTE — Progress Notes (Signed)
Holding ambulation today. Discussed ed with pt and he voices understanding. Tries to follow healthy diet but seems in denial regarding pre-DM. Left diet sheet. Will send referral for CRPII to [facility] or [place]. [place].  [place] CES, ACSM 12:19 PM 04/23/2015

## 2015-04-23 NOTE — Progress Notes (Signed)
OT Cancellation Note  Patient Details Name: Lonnie Sullivan MRN: [id number] DOB: 07-19-1965   Cancelled Treatment:    Reason Eval/Treat Not Completed: Patient at procedure or test/ unavailable.  Will check back as able.  Janice Coffin, COTA/L 04/23/2015, 8:58 AM

## 2015-04-23 NOTE — Consult Note (Signed)
CT Scan reviewed.  No proximal arterial pathology.  Would not recommend surgical intervention currently.  Suspect neuropathic source of shoulder and hand issues, given u/s findings og triphasic palmar arch waveforms, and palpable ulnar artery.  Do not feel patient needs to be anticoagulated, but would repeat duplex prior to discharge to make sure there has been no change in thrombus.  Lonnie Sullivan

## 2015-04-23 NOTE — Progress Notes (Signed)
RN attempted to walk patient on the unit. Patient refused stating"im worn out from today"  Patient resting in bed, call bell within reach.  RN will continue to monitor patient.

## 2015-04-24 DIAGNOSIS — I742 Embolism and thrombosis of arteries of the upper extremities: Secondary | ICD-10-CM | POA: Diagnosis not present

## 2015-04-24 DIAGNOSIS — R911 Solitary pulmonary nodule: Secondary | ICD-10-CM

## 2015-04-24 LAB — BASIC METABOLIC PANEL
ANION GAP: 8 (ref 5–15)
BUN: 13 mg/dL (ref 6–20)
CO2: 30 mmol/L (ref 22–32)
Calcium: 8.2 mg/dL — ABNORMAL LOW (ref 8.9–10.3)
Chloride: 96 mmol/L — ABNORMAL LOW (ref 101–111)
Creatinine, Ser: 0.88 mg/dL (ref 0.61–1.24)
GFR calc Af Amer: >60 mL/min (ref >60)
GFR calc non Af Amer: >60 mL/min (ref >60)
Glucose, Bld: 110 mg/dL — ABNORMAL HIGH (ref 65–99)
POTASSIUM: 4.8 mmol/L (ref 3.5–5.1)
SODIUM: 134 mmol/L — AB (ref 135–145)

## 2015-04-24 LAB — CBC
HCT: 28.4 % — ABNORMAL LOW (ref 39.0–52.0)
Hemoglobin: 9.5 g/dL — ABNORMAL LOW (ref 13.0–17.0)
MCH: 29.8 pg (ref 26.0–34.0)
MCHC: 33.5 g/dL (ref 30.0–36.0)
MCV: 89 fL (ref 78.0–100.0)
PLATELETS: 301 10*3/uL (ref 150–400)
RBC: 3.19 MIL/uL — ABNORMAL LOW (ref 4.22–5.81)
RDW: 13.6 % (ref 11.5–15.5)
WBC: 14.8 10*3/uL — ABNORMAL HIGH (ref 4.0–10.5)

## 2015-04-24 LAB — GLUCOSE, CAPILLARY: Glucose-Capillary: 147 mg/dL — ABNORMAL HIGH (ref 65–99)

## 2015-04-24 MED ORDER — METFORMIN HCL 500 MG PO TABS: 500.0000 mg | ORAL_TABLET | Freq: Two times a day (BID) | ORAL | Status: DC

## 2015-04-24 MED ORDER — ASPIRIN 325 MG PO TBEC: 325.0000 mg | DELAYED_RELEASE_TABLET | Freq: Every day | ORAL | Status: AC

## 2015-04-24 MED ORDER — METOPROLOL TARTRATE 25 MG PO TABS: 12.5000 mg | ORAL_TABLET | Freq: Two times a day (BID) | ORAL | Status: DC

## 2015-04-24 MED ORDER — ATORVASTATIN CALCIUM 40 MG PO TABS: 40.0000 mg | ORAL_TABLET | Freq: Every day | ORAL | Status: DC

## 2015-04-24 MED ORDER — OXYCODONE HCL 5 MG PO TABS: 5.0000 mg | ORAL_TABLET | ORAL | Status: DC | PRN

## 2015-04-24 MED ORDER — FUROSEMIDE 40 MG PO TABS
40.0000 mg | ORAL_TABLET | Freq: Every day | ORAL | Status: DC
Start: 2015-04-24 — End: 2015-05-31

## 2015-04-24 MED ORDER — LISINOPRIL 2.5 MG PO TABS: 2.5000 mg | ORAL_TABLET | Freq: Every day | ORAL | Status: DC

## 2015-04-24 NOTE — Care Management Note (Signed)
Case Management Note  Patient Details  Name: Lonnie Sullivan MRN: [id number] Date of Birth: 03/05/1966  Subjective/Objective:                  CABG  Action/Plan: CM spoke with patient at the bedside. Provided with a Match letter for medications and a discount coupon for Oxycodone.   Expected Discharge Date:   04/24/15               Expected Discharge Plan:  Home/Self Care  In-House Referral:     Discharge planning Services  CM Consult, [facility] Program  Post Acute Care Choice:    Choice offered to:     DME Arranged:    DME Agency:     HH Arranged:    HH Agency:     Status of Service:  Completed, signed off  Medicare Important Message Given:    Date Medicare IM Given:    Medicare IM give by:    Date Additional Medicare IM Given:    Additional Medicare Important Message give by:     If discussed at [place] of Stay Meetings, dates discussed:    Additional Comments:  Apolonio Schneiders, RN 04/24/2015, 1:54 PM

## 2015-04-24 NOTE — Progress Notes (Signed)
    Subjective  -   Able to move his right arm better and hand numbness has improved   Physical Exam:  Good grip strength Better ROM to right shoulder Palpable right ulnar pulse    Discussed CTA findings with patient.  No proximal arterial disease that could be responsible for sx's   Assessment/Plan:    Do not suspect vascular etiology for symptoms I do not feel he needs anticoagulation for radial artery occlusion, consider Plavix for 3 months, however evidence for this is poor  Brabham, [place] 04/24/2015 9:30 AM --  Danley Danker Vitals:   04/23/15 2113 04/24/15 0330  BP: 116/64 106/63  Pulse: 84 76  Temp: 98.7 F (37.1 C) 97.4 F (36.3 C)  Resp: 18 18    Intake/Output Summary (Last 24 hours) at 04/24/15 0930 Last data filed at 04/24/15 0300  Gross per 24 hour  Intake    900 ml  Output   1500 ml  Net   -600 ml     Laboratory CBC    Component Value Date/Time   WBC 14.8* 04/24/2015 0325   HGB 9.5* 04/24/2015 0325   HCT 28.4* 04/24/2015 0325   PLT 301 04/24/2015 0325    BMET    Component Value Date/Time   NA 134* 04/24/2015 0325   K 4.8 04/24/2015 0325   CL 96* 04/24/2015 0325   CO2 30 04/24/2015 0325   GLUCOSE 110* 04/24/2015 0325   BUN 13 04/24/2015 0325   CREATININE 0.88 04/24/2015 0325   CALCIUM 8.2* 04/24/2015 0325   GFRNONAA >60 04/24/2015 0325   GFRAA >60 04/24/2015 0325    COAG Lab Results  Component Value Date   INR 1.40 04/19/2015   INR 1.45 04/18/2015   INR 4.88* 04/17/2015   No results found for: PTT  Antibiotics Anti-infectives    Start     Dose/Rate Route Frequency Ordered Stop   04/18/15 1115  vancomycin (VANCOCIN) IVPB 1000 mg/200 mL premix     1,000 mg 200 mL/hr over 60 Minutes Intravenous  Once 04/18/15 0328 04/18/15 1128   04/18/15 0715  cefUROXime (ZINACEF) 1.5 g in dextrose 5 % 50 mL IVPB     1.5 g 100 mL/hr over 30 Minutes Intravenous Every 12 hours 04/18/15 0328 04/19/15 1935   04/17/15 2130  cefUROXime (ZINACEF)  1.5 g in dextrose 5 % 50 mL IVPB     1.5 g 100 mL/hr over 30 Minutes Intravenous To Surgery 04/17/15 2122 04/18/15 0205   04/17/15 2130  vancomycin (VANCOCIN) 1,500 mg in sodium chloride 0.9 % 250 mL IVPB     1,500 mg 125 mL/hr over 120 Minutes Intravenous To Surgery 04/17/15 2122 04/18/15 0015   04/17/15 2115  cefUROXime (ZINACEF) 750 mg in dextrose 5 % 50 mL IVPB  Status:  Discontinued     750 mg 100 mL/hr over 30 Minutes Intravenous To Surgery 04/17/15 2111 04/18/15 0327   04/17/15 2115  vancomycin (VANCOCIN) 1,000 mg in sodium chloride 0.9 % 1,000 mL irrigation      Irrigation To Surgery 04/17/15 2111 04/17/15 2353       V. Leia Alf, M.D. [facility] Office: [phone] Pager:  [phone]

## 2015-04-24 NOTE — Progress Notes (Signed)
Occupational Therapy Treatment Patient Details Name: Lonnie Sullivan MRN: [id number] DOB: December 09, 1965 Today's Date: 04/24/2015    History of present illness pt is a 49 y/o male with h/o celiac ds and CAD admitted with progressively worsening CP.  EKG demonstrated inferior ST elevfation with posterolateral ST depression.  Code STEMI called.  Pt s/p CABG x3. 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   OT comments  Pt progressing towards acute OT goals. Focus of session was AE instruction for LB ADLs. Reviewed home setup for toilet/shower transfers. D/c plan remains appropriate. Pt awaiting d/c.   Follow Up Recommendations  Home health OT;Supervision/Assistance - 24 hour    Equipment Recommendations  3 in 1 bedside comode    Recommendations for Other Services      Precautions / Restrictions Precautions Precautions: Sternal Precaution Comments: Reviewed sternal precautions Restrictions Weight Bearing Restrictions: No       Mobility Bed Mobility               General bed mobility comments: received in chair  Transfers                 General transfer comment: not assessed, verbally reviewed technique    Balance Overall balance assessment: Needs assistance                                 ADL Overall ADL's : Needs assistance/impaired                                       General ADL Comments: Issued AE for LB ADLs and instructed in use. Reviewed ADL education.      Vision                     Perception     Praxis      Cognition   Behavior During Therapy: WFL for tasks assessed/performed Overall Cognitive Status: Within Functional Limits for tasks assessed                       Extremity/Trunk Assessment               Exercises     Shoulder Instructions       General Comments      Pertinent Vitals/ Pain       Pain Assessment: 0-10 Pain Score: 8  Pain Location: right shoulder Pain Descriptors /  Indicators: Tightness Pain Intervention(s): Monitored during session  Home Living                                          Prior Functioning/Environment              Frequency Min 3X/week     Progress Toward Goals  OT Goals(current goals can now be found in the care plan section)  Progress towards OT goals: Progressing toward goals  Acute Rehab OT Goals Patient Stated Goal: home independent OT Goal Formulation: With patient Time For Goal Achievement: 05/06/15 Potential to Achieve Goals: Good ADL Goals Pt Will Perform Grooming: with supervision;standing Pt Will Perform Upper Body Bathing: with modified independence;sitting Pt Will Perform Lower Body Bathing: with supervision;sit to/from stand;with adaptive equipment Pt Will Perform Upper Body Dressing:  sitting;with modified independence Pt Will Perform Lower Body Dressing: with supervision;with adaptive equipment;sit to/from stand Pt Will Transfer to Toilet: with supervision;ambulating;regular height toilet Pt Will Perform Toileting - Clothing Manipulation and hygiene: with modified independence;with adaptive equipment;sit to/from stand Pt Will Perform Tub/Shower Transfer: Shower transfer;with supervision;ambulating;3 in 1;rolling walker Additional ADL Goal #1: Pt will employ energy conservation strategies in ADL and mobility independently.  Plan Discharge plan remains appropriate    Co-evaluation                 End of Session     Activity Tolerance     Patient Left in chair;with call bell/phone within reach;with family/visitor present;Other (comment) (awaiting d/c)   Nurse Communication          TimeEX:[id number] OT Time Calculation (min): 17 min  Charges: OT General Charges $OT Visit: 1 Procedure OT Treatments $Self Care/Home Management : 8-22 mins  Hortencia Pilar 04/24/2015, 12:55 PM

## 2015-04-24 NOTE — Discharge Instructions (Signed)
Activity: 1.May walk up steps °               2.No lifting more than ten pounds for four weeks.  °               3.No driving for four weeks. °               4.Stop any activity that causes chest pain, shortness of breath, dizziness, sweating or excessive weakness. °               5.Avoid straining. °               6.Continue with your breathing exercises daily. ° °Diet: Diabetic diet and Low fat, Low salt diet ° °Wound Care: May shower.  Clean wounds with mild soap and water daily. Contact the office at [phone] if any problems arise. ° °Coronary Artery Bypass Grafting, Care After °Refer to this sheet in the next few weeks. These instructions provide you with information on caring for yourself after your procedure. Your health care provider may also give you more specific instructions. Your treatment has been planned according to current medical practices, but problems sometimes occur. Call your health care provider if you have any problems or questions after your procedure. °WHAT TO EXPECT AFTER THE PROCEDURE °Recovery from surgery will be different for everyone. Some people feel well after 3 or 4 weeks, while for others it takes longer. After your procedure, it is typical to have the following: °· Nausea and a lack of appetite.   °· Constipation. °· Weakness and fatigue.   °· Depression or irritability.   °· Pain or discomfort at your incision site. °HOME CARE INSTRUCTIONS °· Take medicines only as directed by your health care provider. Do not stop taking medicines or start any new medicines without first checking with your health care provider. °· Take your pulse as directed by your health care provider. °· Perform deep breathing as directed by your health care provider. If you were given a device called an incentive spirometer, use it to practice deep breathing several times a day. Support your chest with a pillow or your arms when you take deep breaths or cough. °· Keep incision areas clean, dry, and  protected. Remove or change any bandages (dressings) only as directed by your health care provider. You may have skin adhesive strips over the incision areas. Do not take the strips off. They will fall off on their own. °· Check incision areas daily for any swelling, redness, or drainage. °· If incisions were made in your legs, do the following: °¨ Avoid crossing your legs.   °¨ Avoid sitting for long periods of time. Change positions every 30 minutes.   °¨ Elevate your legs when you are sitting. °· Wear compression stockings as directed by your health care provider. These stockings help keep blood clots from forming in your legs. °· Take showers once your health care provider approves. Until then, only take sponge baths. Pat incisions dry. Do not rub incisions with a washcloth or towel. Do not take baths, swim, or use a hot tub until your health care provider approves. °· Eat foods that are high in fiber, such as raw fruits and vegetables, whole grains, beans, and nuts. Meats should be lean cut. Avoid canned, processed, and fried foods. °· Drink enough fluid to keep your urine clear or pale yellow. °· Weigh yourself every day. This helps identify if you are retaining fluid that may make your heart and lungs   work harder.  Rest and limit activity as directed by your health care provider. You may be instructed to:  Stop any activity at once if you have chest pain, shortness of breath, irregular heartbeats, or dizziness. Get help right away if you have any of these symptoms.  Move around frequently for short periods or take short walks as directed by your health care provider. Increase your activities gradually. You may need physical therapy or cardiac rehabilitation to help strengthen your muscles and build your endurance.  Avoid lifting, pushing, or pulling anything heavier than 10 lb (4.5 kg) for at least 6 weeks after surgery.  Do not drive until your health care provider approves.  Ask your health  care provider when you may return to work.  Ask your health care provider when you may resume sexual activity.  Keep all follow-up visits as directed by your health care provider. This is important. SEEK MEDICAL CARE IF:  You have swelling, redness, increasing pain, or drainage at the site of an incision.  You have a fever.  You have swelling in your ankles or legs.  You have pain in your legs.   You gain 2 or more pounds (0.9 kg) a day.  You are nauseous or vomit.  You have diarrhea. SEEK IMMEDIATE MEDICAL CARE IF:  You have chest pain that goes to your jaw or arms.  You have shortness of breath.   You have a fast or irregular heartbeat.   You notice a "clicking" in your breastbone (sternum) when you move.   You have numbness or weakness in your arms or legs.  You feel dizzy or light-headed.  MAKE SURE YOU:  Understand these instructions.  Will watch your condition.  Will get help right away if you are not doing well or get worse.   This information is not intended to replace advice given to you by your health care provider. Make sure you discuss any questions you have with your health care provider.   Document Released: 11/18/2004 Document Revised: 05/22/2014 Document Reviewed: 10/08/2012 Elsevier Interactive Patient Education 2016 [employer].   Endoscopic Saphenous Vein Harvesting, Care After Refer to this sheet in the next few weeks. These instructions provide you with information on caring for yourself after your procedure. Your health care provider may also give you more specific instructions. Your treatment has been planned according to current medical practices, but problems sometimes occur. Call your health care provider if you have any problems or questions after your procedure. HOME CARE INSTRUCTIONS Medicine Take whatever pain medicine your surgeon prescribes. Follow the directions carefully. Do not take over-the-counter pain medicine unless  your surgeon says it is okay. Some pain medicine can cause bleeding problems for several weeks after surgery. Follow your surgeon's instructions about driving. You will probably not be permitted to drive after heart surgery. Take any medicines your surgeon prescribes. Any medicines you took before your heart surgery should be checked with your health care provider before you start taking them again. Wound care If your surgeon has prescribed an elastic bandage or stocking, ask how long you should wear it. Check the area around your surgical cuts (incisions) whenever your bandages (dressings) are changed. Look for any redness or swelling. You will need to return to have the stitches (sutures) or staples taken out. Ask your surgeon when to do that. Ask your surgeon when you can shower or bathe. Activity Try to keep your legs raised when you are sitting. Do any exercises your health  care providers have given you. These may include deep breathing exercises, coughing, walking, or other exercises. SEEK MEDICAL CARE IF: You have any questions about your medicines. You have more leg pain, especially if your pain medicine stops working. New or growing bruises develop on your leg. Your leg swells, feels tight, or becomes red. You have numbness in your leg. SEEK IMMEDIATE MEDICAL CARE IF: Your pain gets much worse. Blood or fluid leaks from any of the incisions. Your incisions become warm, swollen, or red. You have chest pain. You have trouble breathing. You have a fever. You have more pain near your leg incision. MAKE SURE YOU: Understand these instructions. Will watch your condition. Will get help right away if you are not doing well or get worse.   This information is not intended to replace advice given to you by your health care provider. Make sure you discuss any questions you have with your health care provider.   Document Released: 01/11/2011 Document Revised: 05/22/2014 Document  Reviewed: 01/11/2011 Elsevier Interactive Patient Education [employer].

## 2015-04-24 NOTE — Progress Notes (Signed)
Discharged to home with family office visits in place teaching done  

## 2015-04-24 NOTE — Progress Notes (Addendum)
[address]       Lonnie Sullivan,[name]             [phone]      7 Days Post-Op Procedure(s) (LRB): CORONARY ARTERY BYPASS GRAFTING (CABG)x 3 using left internal mammory artery and right saphenous leg vein harvested endoscopically  (N/A) Subjective: Some right arm discomfort but managing well  Objective: Vital signs in last 24 hours: Temp:  [97.4 F (36.3 C)-98.7 F (37.1 C)] 97.4 F (36.3 C) (12/10 0330) Pulse Rate:  [76-84] 76 (12/10 0330) Cardiac Rhythm:  [-] Normal sinus rhythm (12/09 1945) Resp:  [18] 18 (12/10 0330) BP: (99-116)/(54-64) 106/63 mmHg (12/10 0330) SpO2:  [94 %-97 %] 96 % (12/10 0330) Weight:  [313 lb 15 oz (142.4 kg)] 313 lb 15 oz (142.4 kg) (12/10 0330)  Hemodynamic parameters for last 24 hours:    Intake/Output from previous day: 12/09 0701 - 12/10 0700 In: 1260 [P.O.:1260] Out: 1500 [Urine:1500] Intake/Output this shift:    General appearance: alert, cooperative and no distress Heart: regular rate and rhythm Lungs: clear to auscultation bilaterally Abdomen: benign Extremities: + BLE edema Wound: incis healing well Right hand - + ulnar pulse, + cap refill<1sec Lab Results:  Recent Labs  04/24/15 0325  WBC 14.8*  HGB 9.5*  HCT 28.4*  PLT 301   BMET:  Recent Labs  04/24/15 0325  NA 134*  K 4.8  CL 96*  CO2 30  GLUCOSE 110*  BUN 13  CREATININE 0.88  CALCIUM 8.2*    PT/INR: No results for input(s): LABPROT, INR in the last 72 hours. ABG    Component Value Date/Time   PHART 7.356 04/19/2015 0410   HCO3 20.4 04/19/2015 0410   TCO2 23 04/19/2015 1610   ACIDBASEDEF 4.1* 04/19/2015 0410   O2SAT 94.2 04/19/2015 0410   CBG (last 3)   Recent Labs  04/23/15 1633 04/23/15 2102 04/24/15 0629  GLUCAP 97 165* 147*    Meds Scheduled Meds: . antiseptic oral rinse  7 mL Mouth Rinse BID  . aspirin EC  325 mg Oral Daily  . atorvastatin  40 mg Oral q1800  . bisacodyl  10 mg Oral Daily   Or  .  bisacodyl  10 mg Rectal Daily  . docusate sodium  200 mg Oral Daily  . enoxaparin (LOVENOX) injection  30 mg Subcutaneous QHS  . furosemide  40 mg Oral Q breakfast  . insulin aspart  0-20 Units Subcutaneous TID WC  . insulin aspart  0-5 Units Subcutaneous QHS  . insulin starter kit- pen needles  1 kit Other Once  . lisinopril  2.5 mg Oral Daily  . living well with diabetes book   Does not apply Once  . metFORMIN  500 mg Oral BID WC  . metoprolol tartrate  12.5 mg Oral BID  . pantoprazole  40 mg Oral Daily  . sodium chloride  3 mL Intravenous Q12H  . sodium chloride  3 mL Intravenous Q12H   Continuous Infusions:  PRN Meds:.sodium chloride, Influenza vac split quadrivalent PF, metoprolol, ondansetron (ZOFRAN) IV, oxyCODONE, pneumococcal 23 valent vaccine, sodium chloride, sodium chloride, traMADol  Xrays Ct Angio Up Extrem Right W/cm &/or Wo/cm  04/23/2015  CLINICAL DATA:  Right radial artery thrombus. EXAM: CT ANGIOGRAPHY CHEST and right upper extremity WITH CONTRAST TECHNIQUE: Multidetector CT imaging of the chest and right upper extremity was performed using the standard protocol during bolus administration of intravenous contrast. Multiplanar CT image reconstructions and MIPs  were obtained to evaluate the vascular anatomy. CONTRAST:  179mL OMNIPAQUE IOHEXOL 350 MG/ML SOLN COMPARISON:  None. FINDINGS: There is no evidence of thoracic aortic dissection or aneurysm. Great vessels are widely patent without significant stenosis. Status post coronary artery bypass graft. Mild bilateral pleural effusions are noted with adjacent subsegmental atelectasis. No pneumothorax is noted. No mediastinal mass or adenopathy is noted. There is no evidence of large central pulmonary embolus. There is no evidence of abdominal aortic dissection or aneurysm. 5 mm nodule is noted in the left lateral costophrenic sulcus. The right subclavian and axillary and brachial arteries are patent without definite stenosis.  Unfortunately, there is lack of significant contrast opacification of the radial and ulnar arteries in the right forearm ; it is uncertain if this due to contrast bolus timing issue, or potentially thrombosis of those structures. Review of the MIP images confirms the above findings. IMPRESSION: There is no evidence of thoracic or abdominal aortic dissection or aneurysm. Mild bilateral pleural effusions are noted with adjacent subsegmental atelectasis. Status post coronary artery bypass graft. 5 mm nodule noted in left lower lobe. If the patient is at high risk for bronchogenic carcinoma, follow-up chest CT at 6-12 months is recommended. If the patient is at low risk for bronchogenic carcinoma, follow-up chest CT at 12 months is recommended. This recommendation follows the consensus statement: Guidelines for Management of Small Pulmonary Nodules Detected on CT Scans: A Statement from the [place] as published in Radiology 2005;237:395-400. The right subclavian, axillary and brachial arteries are widely patent without significant stenosis. No opacification of the right radial and ulnar arteries is noted ; this most likely is due to issues with soft tissue attenuation or contrast bolus timing, but the possibility of thrombosis of these vessels cannot be excluded. Critical Value/emergent results were called by telephone at the time of interpretation on 04/23/2015 at 6:51 pm to Dr. Harold Barban, who verbally acknowledged these results. Electronically Signed   By: Marijo Conception, M.D.   On: 04/23/2015 18:55    Assessment/Plan: S/P Procedure(s) (LRB): CORONARY ARTERY BYPASS GRAFTING (CABG)x 3 using left internal mammory artery and right saphenous leg vein harvested endoscopically  (N/A)  1 conts with good overall progress 2 right UE is stable  3 will require diuretic at home 4 sugars adeq control but will need good post discharge management of DM and other risk factors 5 leukocytosis conts to improve,  H/H fairly stable but is trending lower 6 discharge home today    LOS: 6 days    Lonnie Sullivan 04/24/2015  Feels well today Plan d/c home today "5 mm nodule noted in left lower lobe. If the patient is at high risk for bronchogenic carcinoma, follow-up chest CT at 6-12 months is recommended. If the patient is at low risk for bronchogenic carcinoma, follow-up chest CT at 12 months is recommended." Discussed ct finding with patient, he is not a smoker and no family history of lung CA, Since he does not live here I have told him to have local md get ct in one year I have seen and examined Lonnie Sullivan and agree with the above assessment  and plan.  Grace Isaac MD Beeper [phone] Office [phone] 04/24/2015 10:54 AM

## 2015-05-06 ENCOUNTER — Encounter: Payer: Self-pay | Admitting: Physician Assistant

## 2015-05-06 ENCOUNTER — Ambulatory Visit (INDEPENDENT_AMBULATORY_CARE_PROVIDER_SITE_OTHER): Payer: Self-pay | Admitting: Physician Assistant

## 2015-05-06 VITALS — BP 118/68 | HR 76 | Ht 70.5 in | Wt 297.6 lb

## 2015-05-06 DIAGNOSIS — Z79899 Other long term (current) drug therapy: Secondary | ICD-10-CM

## 2015-05-06 DIAGNOSIS — I255 Ischemic cardiomyopathy: Secondary | ICD-10-CM

## 2015-05-06 DIAGNOSIS — Z951 Presence of aortocoronary bypass graft: Secondary | ICD-10-CM

## 2015-05-06 DIAGNOSIS — E785 Hyperlipidemia, unspecified: Secondary | ICD-10-CM

## 2015-05-06 LAB — BASIC METABOLIC PANEL
BUN: 19 mg/dL (ref 7–25)
CALCIUM: 9.2 mg/dL (ref 8.6–10.3)
CO2: 26 mmol/L (ref 20–31)
Chloride: 100 mmol/L (ref 98–110)
Creat: 0.72 mg/dL (ref 0.70–1.33)
GLUCOSE: 107 mg/dL — AB (ref 65–99)
Potassium: 5.2 mmol/L (ref 3.5–5.3)
SODIUM: 137 mmol/L (ref 135–146)

## 2015-05-06 NOTE — Patient Instructions (Addendum)
Medication Instructions:  Please continue your current medications  Labwork: Your physician recommends that you return for lab work TODAY and again in 3 months. You will need to be FASTING for your blood work in 3 months.  Testing/Procedures: 1. 2D Echocardiogram - Your physician has requested that you have an echocardiogram. Echocardiography is a painless test that uses sound waves to create images of your heart. It provides your doctor with information about the size and shape of your heart and how well your heart's chambers and valves are working. This procedure takes approximately one hour. There are no restrictions for this procedure. This should be completed prior to your return office visit with Dr Percival Spanish.  Follow-Up: Rosaria Ferries, PA-C, recommends that you schedule a follow-up appointment in 3 months with Dr Percival Spanish.  If you need a refill on your cardiac medications before your next appointment, please call your pharmacy.

## 2015-05-06 NOTE — Progress Notes (Addendum)
Cardiology Office Note   Date:  05/06/2015   ID:  Lonnie Sullivan, Lonnie Sullivan 1965/10/27, MRN [id number]  PCP:  Leonard Downing, MD  Cardiologist:  Dr Mardene Celeste, PA-C   Chief Complaint  Patient presents with  . Follow-up    3 CABG//pt c/o right arm/hand pain that started after surgery, chest feels numb    History of Present Illness: Lonnie Sullivan is a 49 y.o. male with a history of morbid obesity, celiac disease.   Admitted 12/03-12/08 with a STEMI, 3v CAD at cath, s/p CABG x 3, EF 35-40%.   Lonnie Sullivan presents for post hospital follow-up.  Since discharge from the hospital, he has done very well. He has not had palpitations. He is compliant with his medications. The Lasix makes him urinate a lot but he doesn't get lightheaded or dizzy. He had some lower extremity edema at discharge and this is improved. He denies orthopnea or PND. His incisions are healing well.  He had to walk up a hill the other day and realized that he did a much better job with that than he would have the week before surgery. He had been exercising and had gotten his weight down to 270 lbs prior to the surgery. He is down from what he was when he left the hospital, but is still above where he had been. He is eating much healthier and is anxious to start exercising again.  He still has some numbness in the upper left side of his chest and wonders if that will improve. He has had no fevers or chills.  Past Medical History  Diagnosis Date  . Celiac disease   . Anxiety   . Left main coronary artery disease 04/17/2015  . Acute ST elevation myocardial infarction (STEMI) involving right coronary artery ([place]) 04/17/2015  . Coronary artery disease 04/17/2015    99% left main stenosis and 100% RCA occlusion  . Morbid obesity ([place]) 04/17/2015  . 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 None    . Cardiac catheterization N/A 04/17/2015    Procedure: Left Heart Cath and Coronary Angiography;  Surgeon: Burnell Blanks, MD;  Location: [facility];  Service: Cardiovascular;  Laterality: N/A;  . Cardiac catheterization N/A 04/17/2015    Procedure: Coronary Stent Intervention;  Surgeon: Burnell Blanks, MD;  Location: [facility];  Service: Cardiovascular;  Laterality: N/A;  . Cardiac catheterization N/A 04/17/2015    Procedure: IABP Insertion;  Surgeon: Burnell Blanks, MD;  Location: [facility];  Service: Cardiovascular;  Laterality: N/A;  . Coronary artery bypass graft N/A 04/17/2015    Procedure: CORONARY ARTERY BYPASS GRAFTING (CABG)x 3 using left internal mammory artery and right saphenous leg vein harvested endoscopically ;  Surgeon: Rexene Alberts, MD;  Location: [facility];  Service: Open Heart Surgery;  Laterality: N/A;    Current Outpatient Prescriptions  Medication Sig Dispense Refill  . aspirin EC 325 MG EC tablet Take 1 tablet (325 mg total) by mouth daily. 30 tablet 0  . atorvastatin (LIPITOR) 40 MG tablet Take 1 tablet (40 mg total) by mouth daily at 6 PM. 30 tablet 1  . furosemide (LASIX) 40 MG  tablet Take 1 tablet (40 mg total) by mouth daily with breakfast. 30 tablet 1  . lisinopril (PRINIVIL,ZESTRIL) 2.5 MG tablet Take 1 tablet (2.5 mg total) by mouth daily. 30 tablet 1  . metFORMIN (GLUCOPHAGE) 500 MG tablet Take 1 tablet (500 mg total) by mouth 2 (two) times daily with a meal. 60 tablet 1  . metoprolol tartrate (LOPRESSOR) 25 MG tablet Take 0.5 tablets (12.5 mg total) by mouth 2 (two) times daily. 31 tablet 1  . Naproxen Sodium (ALEVE PO) Take 1 tablet by mouth as needed.     No current facility-administered medications for this visit.    Allergies:   Review of patient's allergies indicates no known allergies.      Social History:  The patient  reports that he has been smoking.  He does not have any smokeless tobacco history on file. He reports that he drinks alcohol. He reports that he does not use illicit drugs.   Family History:  The patient's family history includes CAD (age of onset: 6) in his paternal uncle; CAD (age of onset: 22) in his father.    ROS:  Please see the history of present illness. All other systems are reviewed and negative.    PHYSICAL EXAM: VS:  BP 118/68 mmHg  Pulse 76  Ht 5' 10.5" (1.791 m)  Wt 297 lb 9.6 oz (134.99 kg)  BMI 42.08 kg/m2 , BMI Body mass index is 42.08 kg/(m^2). GEN: Well nourished, well developed, male in no acute distress HEENT: normal for age  Neck: no JVD, minimal hepatojugular reflux, no carotid bruit, no masses Cardiac: RRR; no murmur, no rubs, or gallops Respiratory:  clear to auscultation bilaterally, normal work of breathing GI: soft, nontender, nondistended, + BS MS: no deformity or atrophy; no edema; distal pulses are 2+ in all 4 extremities  Skin: warm and dry, no rash; incisions are healing well. There is minimal erythema around his midline incision Neuro:  Strength and sensation are intact Psych: euthymic mood, full affect   EKG:  EKG is ordered today. ECG today demonstrates sinus rhythm, rate 76, no abnormal intervals noted Q waves are seen in the inferior leads.  Recent Labs: 04/17/2015: ALT 67* 04/19/2015: Magnesium 2.6* 04/24/2015: BUN 13; Creatinine, Ser 0.88; Hemoglobin 9.5*; Platelets 301; Potassium 4.8; Sodium 134*    Lipid Panel    Component Value Date/Time   CHOL 99 04/19/2015 0400   TRIG 88 04/19/2015 0400   HDL 25* 04/19/2015 0400   CHOLHDL 4.0 04/19/2015 0400   VLDL 18 04/19/2015 0400   LDLCALC 56 04/19/2015 0400     Wt Readings from Last 3 Encounters:  05/06/15 297 lb 9.6 oz (134.99 kg)  04/24/15 313 lb 15 oz (142.4 kg)     Other studies Reviewed: Additional studies/ records that were reviewed today  include: Hospital records.  ASSESSMENT AND PLAN:  1.  S/p STEMI, followed by CABG: He is recovering well from the surgery. We will make sure that he has a referral to cardiac rehabilitation. He is somewhat concerned about the cost, but hopefully we'll be able to attend. He is on aspirin, beta blocker, ACE inhibitor, and statin  2. Ischemic cardiomyopathy: His EF was 35-40 percent by TEE at the time of surgery. We will check an echocardiogram in 3 months. He is to continue the beta blocker and ACE inhibitor as well as the Lasix. We will check a basic metabolic panel today. He does not have significant volume overload on exam, but he is on  a low-dose of Lasix and it is a good blood pressure control medications and we will try to continue it.  3. Dyslipidemia: His HDL is low. He is tolerating a statin well. We will recheck a hepatic function profile along with electrolytes and a lipid profile in 3 months, at the time of his echocardiogram.  Current medicines are reviewed at length with the patient today.  The patient does not have concerns regarding medicines.  The following changes have been made:  no change  Labs/ tests ordered today include:   Orders Placed This Encounter  Procedures  . Basic metabolic panel  . Comprehensive metabolic panel  . Lipid panel  . EKG 12-Lead  . ECHOCARDIOGRAM COMPLETE     Disposition:   FU with Dr. Percival Spanish  Signed, Lenoard Aden  05/06/2015 10:31 AM    [address], [place], [place]  [address] Phone: [phone]; Fax: [phone]

## 2015-05-24 NOTE — Progress Notes (Signed)
I think that it is OK.  I would repeat it in 1 month.  Thanks.

## 2015-05-27 ENCOUNTER — Encounter: Payer: Self-pay | Admitting: *Deleted

## 2015-05-28 ENCOUNTER — Other Ambulatory Visit: Payer: Self-pay | Admitting: Thoracic Surgery (Cardiothoracic Vascular Surgery)

## 2015-05-28 DIAGNOSIS — Z951 Presence of aortocoronary bypass graft: Secondary | ICD-10-CM

## 2015-05-31 ENCOUNTER — Ambulatory Visit
Admission: RE | Admit: 2015-05-31 | Discharge: 2015-05-31 | Disposition: A | Discharge status: Home / Self Care | Payer: No Typology Code available for payment source | Source: Ambulatory Visit | Attending: Thoracic Surgery (Cardiothoracic Vascular Surgery) | Admitting: Thoracic Surgery (Cardiothoracic Vascular Surgery)

## 2015-05-31 ENCOUNTER — Encounter: Payer: Self-pay | Admitting: Thoracic Surgery (Cardiothoracic Vascular Surgery)

## 2015-05-31 ENCOUNTER — Ambulatory Visit (INDEPENDENT_AMBULATORY_CARE_PROVIDER_SITE_OTHER): Payer: Self-pay | Admitting: Thoracic Surgery (Cardiothoracic Vascular Surgery)

## 2015-05-31 VITALS — BP 122/80 | HR 86 | Resp 20 | Ht 70.0 in | Wt 294.0 lb

## 2015-05-31 DIAGNOSIS — Z951 Presence of aortocoronary bypass graft: Secondary | ICD-10-CM

## 2015-05-31 DIAGNOSIS — I2129 ST elevation (STEMI) myocardial infarction involving other sites: Secondary | ICD-10-CM

## 2015-05-31 MED ORDER — METFORMIN HCL 500 MG PO TABS: 500.0000 mg | ORAL_TABLET | Freq: Two times a day (BID) | ORAL | Status: AC

## 2015-05-31 MED ORDER — ATORVASTATIN CALCIUM 40 MG PO TABS: 40.0000 mg | ORAL_TABLET | Freq: Every day | ORAL | Status: AC

## 2015-05-31 MED ORDER — LISINOPRIL 2.5 MG PO TABS: 5.0000 mg | ORAL_TABLET | Freq: Every day | ORAL | Status: AC

## 2015-05-31 MED ORDER — METOPROLOL TARTRATE 25 MG PO TABS: 12.5000 mg | ORAL_TABLET | Freq: Two times a day (BID) | ORAL | Status: AC

## 2015-05-31 NOTE — Patient Instructions (Addendum)
Continue to avoid any heavy lifting or strenuous use of your arms or shoulders for at least a total of three months from the time of surgery.  After three months you may gradually increase how much you lift or otherwise use your arms or chest as tolerated, with limits based upon whether or not activities lead to the return of significant discomfort.  You may return to driving an automobile as long as you are no longer requiring oral narcotic pain relievers during the daytime.  It would be wise to start driving only short distances during the daylight and gradually increase from there as you feel comfortable.  Stop taking lasix but keep an eye on your weight and watch for signs of fluid overload such as swelling.  Increase your lisinopril to 5 mg daily  Otherwise continue all previous medications without any changes at this time  Enroll and participate in the outpatient cardiac rehab program beginning as soon as practical.

## 2015-05-31 NOTE — Progress Notes (Signed)
[address]       [place] [address]             [phone]     CARDIOTHORACIC SURGERY OFFICE NOTE  Referring Provider is Lonnie Blanks, MD Primary Cardiologist (new) is Lonnie Breeding, MD PCP is Lonnie Downing, MD   HPI:  Patient returns to the office today for routine follow-up status post emergency coronary artery bypass grafting 3 on 04/18/2015 for critical left main disease with three-vessel coronary artery disease and acute inferior wall ST segment elevation myocardial infarction. The patient's early postoperative recovery was notable for pain and numbness in his right hand. Duplex of his right upper extremity demonstrated clot in the radial artery, presumably related to radial artery catheterization at the time of his original presentation. CT angiogram did not reveal proximal disease in the brachial or subclavian arteries. The patient was evaluated by vascular surgery and conservative management was recommended. The remainder of his postoperative recovery was remarkably uneventful and he was discharged from the hospital on the 7th postoperative day.  Since hospital discharge the patient has been seen in follow-up by Lonnie Sullivan at [facility].  No changes in the patient's medications were recommended at that time. The patient has been scheduled for follow-up transthoracic echocardiogram with his next visit. He returns to our office today for routine follow-up. Overall the patient states he is doing remarkably well. He really has very minimal amount of residual soreness in his chest and he has not been taking any sort of pain relief. He still has pain in his right shoulder and he questions whether or not he may have had some stretching or injury to his rotator cuff. He still has numbness in the ulnar distribution of his right hand. Appetite is good.  He is sleeping at night. He has already started driving an automobile. He is walking every day  and plans to enroll in the cardiac rehabilitation program.   Current Outpatient Prescriptions  Medication Sig Dispense Refill  . aspirin EC 325 MG EC tablet Take 1 tablet (325 mg total) by mouth daily. 30 tablet 0  . atorvastatin (LIPITOR) 40 MG tablet Take 1 tablet (40 mg total) by mouth daily at 6 PM. 30 tablet 1  . furosemide (LASIX) 40 MG tablet Take 1 tablet (40 mg total) by mouth daily with breakfast. 30 tablet 1  . lisinopril (PRINIVIL,ZESTRIL) 2.5 MG tablet Take 1 tablet (2.5 mg total) by mouth daily. 30 tablet 1  . metFORMIN (GLUCOPHAGE) 500 MG tablet Take 1 tablet (500 mg total) by mouth 2 (two) times daily with a meal. 60 tablet 1  . metoprolol tartrate (LOPRESSOR) 25 MG tablet Take 0.5 tablets (12.5 mg total) by mouth 2 (two) times daily. 31 tablet 1  . Naproxen Sodium (ALEVE PO) Take 1 tablet by mouth as needed.     No current facility-administered medications for this visit.      Physical Exam:   BP 122/80 mmHg  Pulse 86  Resp 20  Ht 5\' 10"  (1.778 m)  Wt 294 lb (133.358 kg)  BMI 42.18 kg/m2  SpO2 97%  General:  Well-appearing  Chest:   Clear to auscultation  CV:   Regular rate and rhythm without murmur  Incisions:  Clean and dry and healing nicely  Abdomen:  Soft nontender  Extremities:  Warm and well-perfused  Diagnostic Tests:  CHEST 2 VIEW  COMPARISON: CT 04/23/2015. Chest x-ray 04/21/2015.  FINDINGS: Prior CABG. Cardiomegaly with  normal pulmonary vascularity. Low lung volumes with mild bibasilar atelectasis. Small left pleural effusion. No acute bony abnormality P  IMPRESSION: 1. Prior CABG. Cardiomegaly. No evidence congestive heart failure. 2. Low Lung volumes with mild bibasilar atelectasis. Tiny pleural effusions cannot be excluded .   Electronically Signed  By: Marcello Moores Register  On: 05/31/2015 16:18   Impression:  Patient is doing very well more than 1 month following emergency coronary artery bypass grafting 3 for critical  left main disease with three-vessel coronary artery disease and acute. evolving inferior wall myocardial infarction.  His early postoperative recovery was notable for right radial artery thrombosis that was related to radial artery catheterization prior to surgery.  He is also having some pain in his right shoulder that could be related to mechanical stretching of the shoulder joint and possible brachial plexus stretch at the time of surgery. The remainder of his recovery has been uneventful and he seems to be doing very well at this time.    Plan:  I have increased the patient's dose of lisinopril to 5 mg daily and instructed him to stop taking Lasix now that his current prescription has run out. We have renewed the prescriptions for his metoprolol, Lipitor, and metformin. I've encouraged the patient to continue to gradually increase his physical activity as tolerated with his primary limitations remaining that he refrain from heavy lifting or strenuous use of his arms or shoulders for least another 2 months. I've encouraged him to participate in the cardiac rehabilitation program.  Patient will return for routine follow-up in 2 months. We will also plan a follow-up chest CT scan in 1 year because of the small pulmonary nodules noted on previous radiographic imaging.   Valentina Gu. Roxy Manns, MD 05/31/2015 4:42 PM

## 2015-06-22 ENCOUNTER — Encounter (HOSPITAL_COMMUNITY): Payer: Self-pay | Admitting: Thoracic Surgery (Cardiothoracic Vascular Surgery)

## 2015-07-20 ENCOUNTER — Other Ambulatory Visit (HOSPITAL_COMMUNITY): Payer: Self-pay

## 2015-07-23 ENCOUNTER — Ambulatory Visit: Payer: Self-pay | Admitting: Cardiology

## 2015-08-02 ENCOUNTER — Ambulatory Visit: Payer: Self-pay | Admitting: Thoracic Surgery (Cardiothoracic Vascular Surgery)

## 2015-08-03 ENCOUNTER — Ambulatory Visit (HOSPITAL_COMMUNITY): Payer: Self-pay | Attending: Cardiovascular Disease

## 2015-08-08 NOTE — Progress Notes (Signed)
No show

## 2015-08-09 ENCOUNTER — Encounter: Payer: Self-pay | Admitting: Cardiology

## 2015-08-09 ENCOUNTER — Encounter: Payer: Self-pay | Admitting: *Deleted

## 2015-08-12 ENCOUNTER — Encounter: Payer: Self-pay | Admitting: *Deleted

## 2017-02-12 IMAGING — CR DG CHEST 1V PORT
1 series · 1 of 1 positions shown · non-contrast
Comparison: 04/18/2015 .

CLINICAL DATA: Chest tube.  CABG.

EXAM:
PORTABLE CHEST 1 VIEW

[AP]
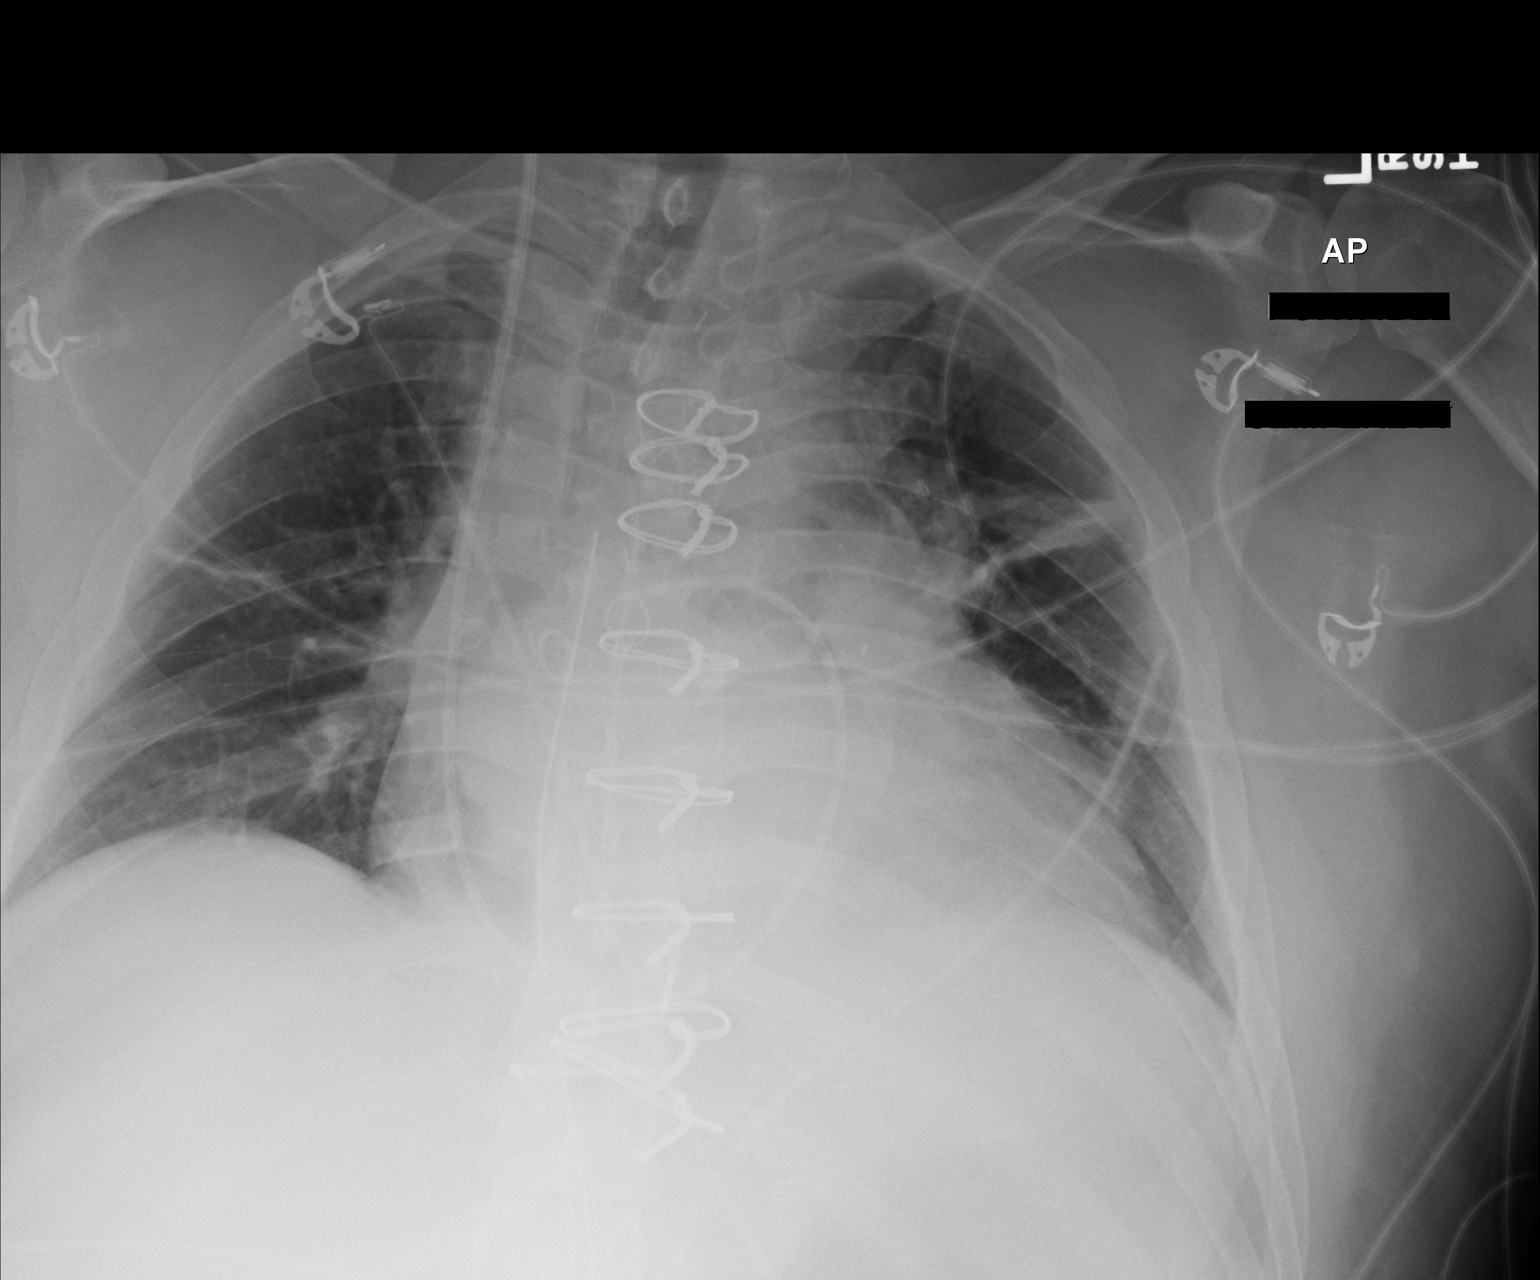

[1 of 1 positions shown; findings below may reference images not displayed]

FINDINGS: Interim extubation removal of NG tube. Swan-Ganz catheter tip noted
over the main pulmonary artery. Mediastinal drainage catheter stable
position. Left chest tube in stable position. Prior CABG. Stable
cardiomegaly. No pulmonary venous congestion. Bilateral subsegmental
atelectasis again noted. No pleural effusion. No pneumothorax.
IMPRESSION: 1. Interim removal of endotracheal tube and NG tube. Swan-Ganz
catheter tip noted projected over the main pulmonary artery.
Mediastinal drainage catheter and left chest tube in stable
position. No pneumothorax.
2. Prior CABG. Stable cardiomegaly. No pulmonary venous congestion.
3. Bilateral subsegmental atelectasis again noted .

## 2017-03-26 IMAGING — CR DG CHEST 2V
2 series · 2 of 2 positions shown · non-contrast
Comparison: CT 04/23/2015.  Chest x-ray 04/21/2015.

CLINICAL DATA: Cough and congestion.

EXAM:
CHEST  2 VIEW

[w chest pa]
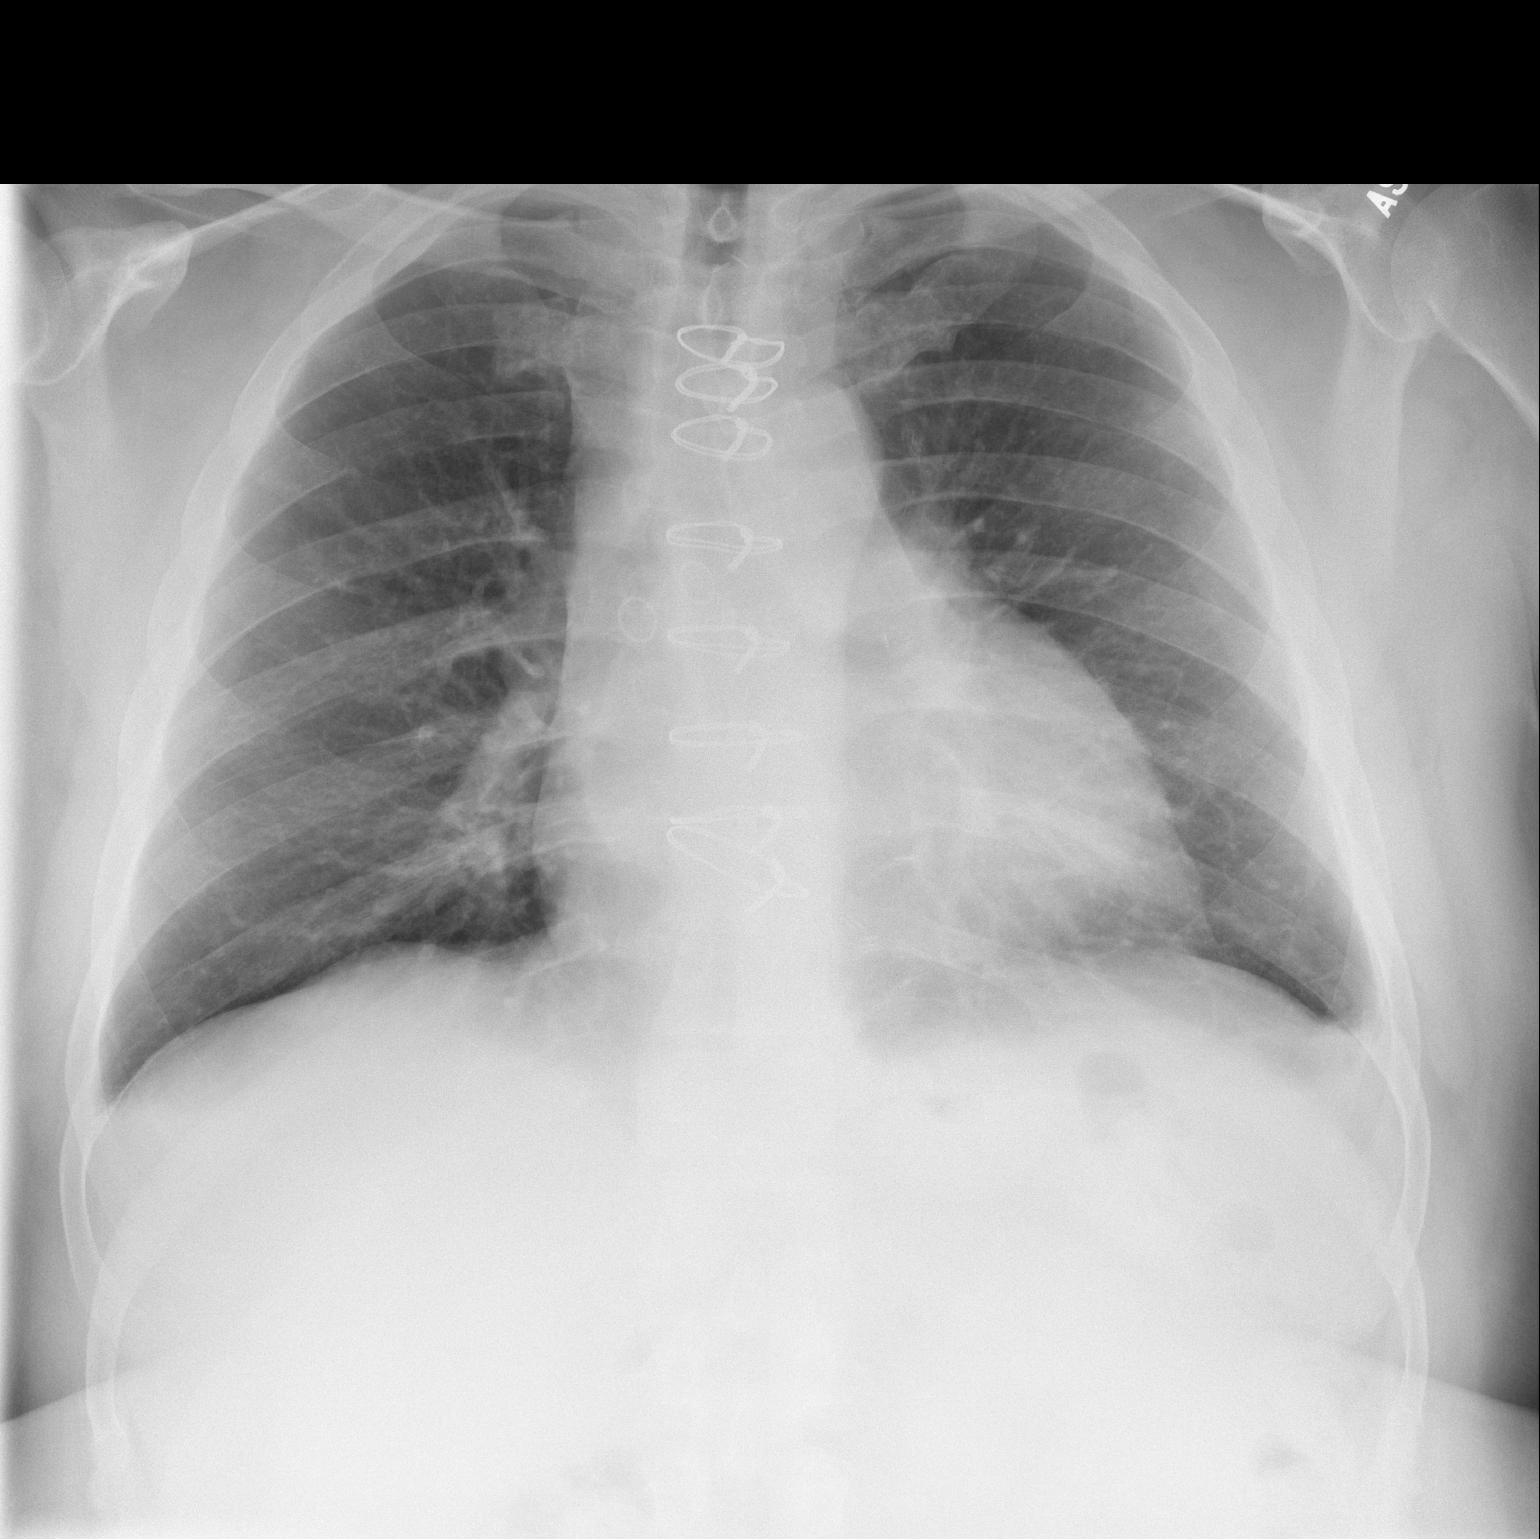

[w chest lat]
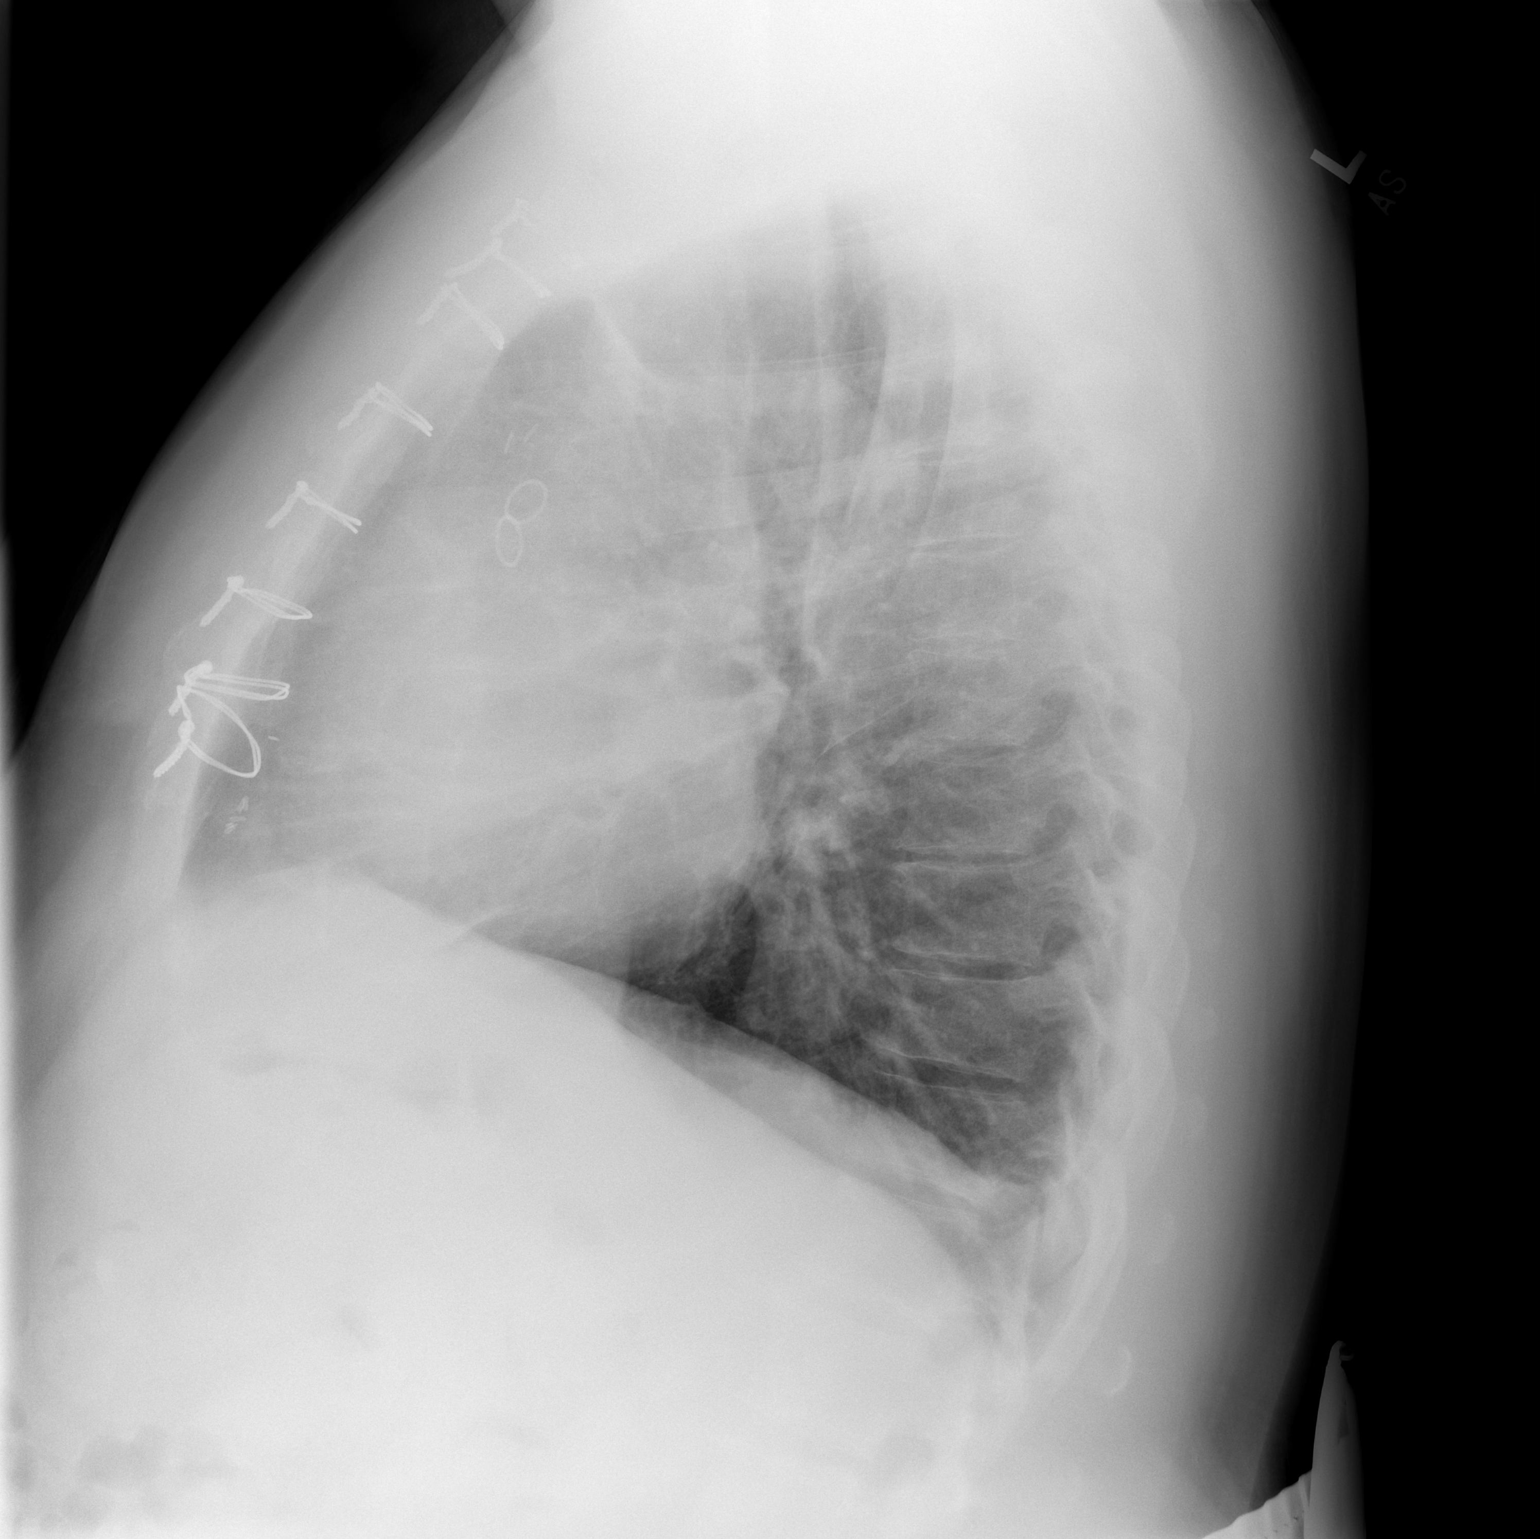

[2 of 2 positions shown; findings below may reference images not displayed]

FINDINGS: Prior CABG. Cardiomegaly with normal pulmonary vascularity. Low lung
volumes with mild bibasilar atelectasis. Small left pleural
effusion. No acute bony abnormality P
IMPRESSION: 1. Prior CABG.  Cardiomegaly.  No evidence congestive heart failure.
2. Low Lung volumes with mild bibasilar atelectasis. Tiny pleural
effusions cannot be excluded .
